# Patient Record
Sex: Male | Born: 1935 | Race: White | Hispanic: No | Marital: Single | Smoking: Never smoker
Health system: Southern US, Community
[De-identification: ages and names within clinical notes are randomized; demographics above are authoritative.]

## PROBLEM LIST (undated history)

## (undated) ENCOUNTER — Encounter: Attending: Hematology & Oncology | Primary: Hematology & Oncology

## (undated) ENCOUNTER — Ambulatory Visit: Payer: MEDICARE | Attending: Adult Health | Primary: Adult Health

## (undated) ENCOUNTER — Encounter: Attending: Adult Health | Primary: Adult Health

## (undated) ENCOUNTER — Ambulatory Visit: Payer: MEDICARE

## (undated) ENCOUNTER — Ambulatory Visit

## (undated) ENCOUNTER — Encounter

## (undated) ENCOUNTER — Telehealth: Attending: Hematology & Oncology | Primary: Hematology & Oncology

## (undated) ENCOUNTER — Telehealth: Attending: Adult Health | Primary: Adult Health

## (undated) ENCOUNTER — Telehealth

## (undated) ENCOUNTER — Ambulatory Visit: Payer: MEDICARE | Attending: Hematology & Oncology | Primary: Hematology & Oncology

## (undated) ENCOUNTER — Encounter: Attending: Pharmacist | Primary: Pharmacist

## (undated) ENCOUNTER — Encounter: Attending: Internal Medicine | Primary: Internal Medicine

## (undated) ENCOUNTER — Ambulatory Visit: Attending: Family | Primary: Family

## (undated) ENCOUNTER — Ambulatory Visit: Attending: Cardiovascular Disease | Primary: Cardiovascular Disease

## (undated) ENCOUNTER — Ambulatory Visit: Attending: Internal Medicine | Primary: Internal Medicine

## (undated) DIAGNOSIS — I2510 Atherosclerotic heart disease of native coronary artery without angina pectoris: Secondary | ICD-10-CM

## (undated) DIAGNOSIS — D649 Anemia, unspecified: Secondary | ICD-10-CM

## (undated) DIAGNOSIS — N400 Enlarged prostate without lower urinary tract symptoms: Secondary | ICD-10-CM

## (undated) DIAGNOSIS — M1990 Unspecified osteoarthritis, unspecified site: Secondary | ICD-10-CM

## (undated) DIAGNOSIS — F329 Major depressive disorder, single episode, unspecified: Secondary | ICD-10-CM

## (undated) DIAGNOSIS — J449 Chronic obstructive pulmonary disease, unspecified: Secondary | ICD-10-CM

## (undated) DIAGNOSIS — F419 Anxiety disorder, unspecified: Secondary | ICD-10-CM

## (undated) DIAGNOSIS — I10 Essential (primary) hypertension: Secondary | ICD-10-CM

## (undated) DIAGNOSIS — F0390 Unspecified dementia without behavioral disturbance: Secondary | ICD-10-CM

## (undated) DIAGNOSIS — C9110 Chronic lymphocytic leukemia of B-cell type not having achieved remission: Secondary | ICD-10-CM

## (undated) DIAGNOSIS — H9190 Unspecified hearing loss, unspecified ear: Secondary | ICD-10-CM

## (undated) DIAGNOSIS — K219 Gastro-esophageal reflux disease without esophagitis: Secondary | ICD-10-CM

## (undated) HISTORY — DX: Chronic obstructive pulmonary disease, unspecified: J44.9

## (undated) HISTORY — DX: Essential (primary) hypertension: I10

## (undated) HISTORY — DX: Gastro-esophageal reflux disease without esophagitis: K21.9

## (undated) HISTORY — PX: SKIN CANCER EXCISION: SHX779

## (undated) HISTORY — DX: Benign prostatic hyperplasia without lower urinary tract symptoms: N40.0

## (undated) HISTORY — DX: Unspecified osteoarthritis, unspecified site: M19.90

## (undated) HISTORY — DX: Major depressive disorder, single episode, unspecified: F32.9

## (undated) HISTORY — DX: Atherosclerotic heart disease of native coronary artery without angina pectoris: I25.10

## (undated) HISTORY — DX: Unspecified hearing loss, unspecified ear: H91.90

## (undated) HISTORY — DX: Anemia, unspecified: D64.9

## (undated) HISTORY — DX: Chronic lymphocytic leukemia of B-cell type not having achieved remission: C91.10

## (undated) HISTORY — DX: Unspecified dementia, unspecified severity, without behavioral disturbance, psychotic disturbance, mood disturbance, and anxiety: F03.90

## (undated) HISTORY — DX: Anxiety disorder, unspecified: F41.9

## (undated) MED ORDER — EZETIMIBE 10 MG TABLET: Freq: Every day | ORAL | 0 days

## (undated) MED ORDER — FUROSEMIDE 20 MG TABLET: Freq: Every day | ORAL | 0 days

## (undated) MED ORDER — CALCIUM CARBONATE 500 MG (1,250 MG)-VITAMIN D3 200 UNIT TABLET: Freq: Every morning | ORAL | 0.00000 days

## (undated) MED ORDER — LIPASE-PROTEASE-AMYLASE 36,000-114,000-180,000 UNIT CAPSULE,DELAY REL: ORAL | 0.00000 days

## (undated) MED ORDER — NEXLIZET 180 MG-10 MG TABLET: Freq: Every day | ORAL | 0 days

## (undated) MED ORDER — MELOXICAM 7.5 MG TABLET: Freq: Two times a day (BID) | ORAL | 0.00000 days | PRN

## (undated) MED ORDER — METOPROLOL SUCCINATE ER 25 MG TABLET,EXTENDED RELEASE 24 HR: Freq: Every day | ORAL | 0 days

## (undated) MED ORDER — TIOTROPIUM BROMIDE 18 MCG CAPSULE WITH INHALATION DEVICE: RESPIRATORY_TRACT | 0 days

---

## 2000-07-05 DIAGNOSIS — I219 Acute myocardial infarction, unspecified: Secondary | ICD-10-CM

## 2000-07-05 HISTORY — DX: Acute myocardial infarction, unspecified: I21.9

## 2017-08-12 ENCOUNTER — Encounter
Admit: 2017-08-12 | Discharge: 2017-08-12 | Payer: MEDICARE | Attending: Hematology & Oncology | Primary: Hematology & Oncology

## 2017-08-12 DIAGNOSIS — C9110 Chronic lymphocytic leukemia of B-cell type not having achieved remission: Principal | ICD-10-CM

## 2017-08-23 DIAGNOSIS — C9110 Chronic lymphocytic leukemia of B-cell type not having achieved remission: Secondary | ICD-10-CM | POA: Insufficient documentation

## 2017-08-30 ENCOUNTER — Other Ambulatory Visit: Admit: 2017-08-30 | Discharge: 2017-08-31 | Payer: MEDICARE

## 2017-08-30 ENCOUNTER — Ambulatory Visit
Admit: 2017-08-30 | Discharge: 2017-08-31 | Payer: MEDICARE | Attending: Hematology & Oncology | Primary: Hematology & Oncology

## 2017-08-30 DIAGNOSIS — C9110 Chronic lymphocytic leukemia of B-cell type not having achieved remission: Principal | ICD-10-CM

## 2017-08-30 MED ORDER — IDELALISIB 150 MG TABLET
ORAL_TABLET | Freq: Two times a day (BID) | ORAL | 6 refills | 0.00000 days | Status: CP
Start: 2017-08-30 — End: 2017-11-08

## 2017-08-30 MED ORDER — IDELALISIB 150 MG TABLET: 150 mg | tablet | Freq: Two times a day (BID) | 6 refills | 0 days | Status: AC

## 2017-09-02 ENCOUNTER — Ambulatory Visit: Admit: 2017-09-02 | Discharge: 2017-09-03 | Payer: MEDICARE

## 2017-09-02 DIAGNOSIS — F0390 Unspecified dementia without behavioral disturbance: Principal | ICD-10-CM

## 2017-09-20 NOTE — Unmapped (Signed)
[facility]   Patient Onboarding/Medication Counseling    Eugene Carroll is a 82 y.o. male with leukemia who I am counseling today on initiation of therapy.    Medication: Zydelig 150mg     Verified patient's date of birth / HIPAA.      Education Provided: ??    Dose/Administration discussed: Medication is being administered inpatient at [facility].  Pharmacist: Dicie Beam will counsel patient.     Storage requirements: this medicine should be stored at room temperature.     Side effects / precautions discussed: Medication is being administered inpatient at [facility].  Pharmacist: Dicie Beam will counsel patient.     Handling precautions / disposal reviewed:  Medication is being administered inpatient at [facility].  Pharmacist: Dicie Beam will counsel patient. .    Drug Interactions: other medications reviewed and up to date in Epic.  Medication is being administered inpatient at [facility].  Pharmacist: Dicie Beam will counsel patient. .    Comorbidities/Allergies: reviewed and up to date in Epic.    Verified therapy is appropriate and should continue      Delivery Information    Medication Assistance provided: none    Anticipated copay of $0 reviewed with patient. Verified delivery address in FSI and reviewed medication storage requirement.    Scheduled delivery date: 09/23/2017    Explained that we ship using UPS or courier and this shipment will not require a signature.      Explained the services we provide at [facility] and that each month we would call to set up refills.  Stressed importance of returning phone calls so that we could ensure they receive their medications in time each month.  Informed patient that we should be setting up refills 7-10 days prior to when they will run out of medication.  Informed patient that welcome packet will be sent.      Patient verbalized understanding of the above information as well as how to contact the pharmacy at [phone] option 4 with any questions/concerns.  The pharmacy is open Monday through Friday 8:30am-4:30pm.  A pharmacist is available 24/7 via pager to answer any clinical questions they may have.        Patient Specific Needs      ? Patient has no physical, cognitive, or cultural barriers.    Patient prefers to have medications discussed with  Caregiver (Pharmacist: Dicie Beam)    ? Patient is able to read and understand education materials at a high school level or above.    ? Patient's primary language is  English           Perez Dirico  Anders Grant  [facility] Specialty Pharmacist

## 2017-09-23 MED FILL — ZYDELIG/150MG/TABS: ZYDELIG/150MG/TABS | 30 days supply | Qty: 60 | Fill #0

## 2017-10-07 NOTE — Unmapped (Signed)
[facility] Triage Note     Patient: Eugene Carroll     Reason for call:  diarrhea    Time call returned: 1210     Phone Assessment: Nurse a [facility] called to make Dr. Lonni Fix aware of pt's symptoms ahead of his 4/9 appt.     Pt still having diarrhea. They have treated with imodium and lomotil, but pt was constipated last week, so they are going easy with the imodium. Already on a dairy free diet. Pt also c/o nausea, abdominal discomfort, fatigue, weight loss. On phenergan as well. Pt has a solitary gall stone. LFTs are okay, belly soft with no tenderness. Psychiatrist is asking if pt's drug can be changed d/t side effects.      Triage Recommendations: n/a     Patient Response: Okay to discuss at Tuesday's appt.      Outstanding tasks: notification

## 2017-10-07 NOTE — Unmapped (Signed)
Hi,    The patient nurse Sondra Come has contacted the [facility] with report of diarrhea.  We have sent a page to the Oncology Triage Pager per our urgent page guidelines.      Thank you,  Christell Faith  [facility] Communication Center  [phone]

## 2017-10-07 NOTE — Unmapped (Signed)
Suann Larry (Child psychotherapist) contacted the communication center returning [employer] call on the behalf of Dr. Kyung Rudd.  Tonya did not state the reason for the call.      Dr. Sondra Come may be contacted at [phone] or [phone].    Archie Patten may be reached at [phone].    Thanks in advance,  Tylene Fantasia  [facility] Communication Center  [phone]

## 2017-10-07 NOTE — Unmapped (Signed)
See other message dated today

## 2017-10-11 ENCOUNTER — Ambulatory Visit
Admit: 2017-10-11 | Discharge: 2017-10-12 | Payer: MEDICARE | Attending: Hematology & Oncology | Primary: Hematology & Oncology

## 2017-10-11 ENCOUNTER — Other Ambulatory Visit: Admit: 2017-10-11 | Discharge: 2017-10-12 | Payer: MEDICARE

## 2017-10-11 DIAGNOSIS — C9110 Chronic lymphocytic leukemia of B-cell type not having achieved remission: Principal | ICD-10-CM

## 2017-10-11 LAB — CBC W/ AUTO DIFF
BASOPHILS ABSOLUTE COUNT: 0.1 10*9/L (ref 0.0–0.1)
BASOPHILS RELATIVE PERCENT: 0.8 %
EOSINOPHILS ABSOLUTE COUNT: 0.2 10*9/L (ref 0.0–0.4)
EOSINOPHILS RELATIVE PERCENT: 0.9 %
HEMATOCRIT: 42.5 % (ref 41.0–53.0)
HEMOGLOBIN: 14.5 g/dL (ref 13.5–17.5)
LARGE UNSTAINED CELLS: 3 % (ref 0–4)
LYMPHOCYTES ABSOLUTE COUNT: 10.7 10*9/L — ABNORMAL HIGH (ref 1.5–5.0)
LYMPHOCYTES RELATIVE PERCENT: 58 %
MEAN CORPUSCULAR HEMOGLOBIN CONC: 34.1 g/dL (ref 31.0–37.0)
MEAN CORPUSCULAR HEMOGLOBIN: 29.3 pg (ref 26.0–34.0)
MEAN CORPUSCULAR VOLUME: 85.9 fL (ref 80.0–100.0)
MONOCYTES ABSOLUTE COUNT: 0.7 10*9/L (ref 0.2–0.8)
MONOCYTES RELATIVE PERCENT: 3.6 %
NEUTROPHILS ABSOLUTE COUNT: 6.2 10*9/L (ref 2.0–7.5)
NEUTROPHILS RELATIVE PERCENT: 33.7 %
PLATELET COUNT: 288 10*9/L (ref 150–440)
RED CELL DISTRIBUTION WIDTH: 14.4 % (ref 12.0–15.0)

## 2017-10-11 LAB — COMPREHENSIVE METABOLIC PANEL
ALBUMIN: 3.5 g/dL (ref 3.5–5.0)
ALKALINE PHOSPHATASE: 101 U/L (ref 38–126)
ALT (SGPT): 27 U/L (ref 19–72)
ANION GAP: 9 mmol/L (ref 9–15)
AST (SGOT): 21 U/L (ref 19–55)
BLOOD UREA NITROGEN: 11 mg/dL (ref 7–21)
BUN / CREAT RATIO: 12
CALCIUM: 9.2 mg/dL (ref 8.5–10.2)
CHLORIDE: 100 mmol/L (ref 98–107)
CO2: 21 mmol/L — ABNORMAL LOW (ref 22.0–30.0)
CREATININE: 0.89 mg/dL (ref 0.70–1.30)
EGFR MDRD AF AMER: >=60 mL/min/{1.73_m2} (ref >=60)
EGFR MDRD NON AF AMER: >=60 mL/min/{1.73_m2} (ref >=60)
GLUCOSE RANDOM: 97 mg/dL (ref 65–179)
POTASSIUM: 4.7 mmol/L (ref 3.5–5.0)
PROTEIN TOTAL: 5.5 g/dL — ABNORMAL LOW (ref 6.5–8.3)
SODIUM: 130 mmol/L — ABNORMAL LOW (ref 135–145)

## 2017-10-11 LAB — CREATININE: Creatinine:MCnc:Pt:Ser/Plas:Qn:: 0.89

## 2017-10-11 LAB — SMEAR REVIEW

## 2017-10-11 LAB — BASOPHILS ABSOLUTE COUNT: Lab: 0.1

## 2017-10-11 NOTE — Unmapped (Signed)
Follow up Visit Note    Patient Name: Eugene Carroll  Patient Age: 82 y.o.  Encounter Date: 10/11/2017    Chief complaint/Reason for visit: New Patient visit, CLL    Assessment:  Eugene Carroll is a 82 y.o. male with a past medical history of CAD, CHF, Hx of CVA, HTN, HLD, and anxiety who presents for evaluation of his CLL.     Diagnosed with diffuse lymphadenopathy in May 2014, LN biopsy CD5+, CD23+, CD20 dim. Cytogenetics were consistent with 11q deletion and IGHV was unmutated. He began treatment in 2014 with Bendamustine/Rituximab c/b a mild rash. Therapy was changed to Cytoxan/Rituximab. Response reportedly consistent with a CR in 2015. Developed recurrent disease in 2016 with enlarging abdominal lymphadenopathy. He was observed until 2018 at which time he developed rapidly rising WBC, hot flashes, and night sweats. Therapy re-initiated with monthly Rituximab x 6 (completed Oct 2018) + Idelalisib c/b mild diarrhea. He last saw Eugene Carroll in Dec 2018, still on Idelalisib.    He was recently admitted to [facility] (psychiatric admission). He is here today for recommnedations for ongoing care of his CLL. I have concerns that he is having significant toxicity from the idela (diarrhea) vs disease progression in setting of 20 lb weight loss.     I spoke with his daughter Eugene Carroll who corroborated the history.    I also spoke with Eugene Carroll who notes that pt was given loperamide for his diarrhea - this actually led to constipation and a fecal load was noted on abd x-ray. The pt was given miralax which helped but then he developed diarrhea again, is back on loperamide. He's also had nausea and is on protonix just the past day. She notes poor appetite too. They have been avoiding lactose products.     Recommendations:    1) CLL, unmutated IGVH w/ del11q with recent weight loss  - I recommend to hold idelalisib given ongoing diarrhea and wt loss  - suspect wt loss is due to ongoing diarrhea vs disease progression vs dementia/psych cause  - Recommend Imodium as needed for diarrhea/loose stools.  - return in 2 weeks for visit and CT scan (c/a/p) to evaluate for disease status  - may consider restarting idelalisib at a reduced dose vs continuing a drug holiday vs changing to an alternate therapy (if diarrhea continues and is significant prednisone for colitis)    Infection ppx while on idelalisib  - would continue acyclovir and if resumes idelalisib should also go on PCP prophylaxis  - CMV monitoring; checked today     2) Diarrhea:   - recommend prn imodium for diarrhea control, hopefully idealisib hold will help too (if not, can try steroids)    3) Low IgG levels:   - checked today due to symptoms of persitent cough. Total IgG found to be low at 275.   - would consider IVIG if >1 grade 3 infection is observed.     4) Dementia:   - currently resides in [facility] (psych inpatient stay).     5) Supportive care:   - listed as DNR on paperwork from [facility] today.   - patient's daughter, Eugene Carroll is the best contact [phone]); spoke with her today (also spoke with Eugene Carroll at facility, [phone])    Due to this patient's diagnosis, he is at significant risk for subsequent morbidity and/or mortality.      Eugene Leber, MD      Interval History:  Since last seen here,  he reports poor appetite and worsening diarrhea - has about 4 BMs a day.     Weight is down 21 lbs since last visit.    Otherwise, he denies new constitutional symptoms such as anorexia, weight loss, night sweats or unexplained fevers.  Furthermore, he denies symptoms of marrow failure: unexplained bleeding or bruising, recurrent or unexplained intercurrent infections, dyspnea on exertion, lightheadedness, palpitations or chest pain.  There have been no new or unexplained pains or self-identified masses, swelling or enlarged lymph nodes.    Past Medical, Surgical and Family History were reviewed and pertinent updates were made in the Electronic Medical Record        Oncology History:    Oncology History    CLL  Prognostic testing: 11q deletion present on karyotype and FISH from 11/15/12  IGHV: unmutated at 0.0%, 1-69*01    Treatment history:  Bendamustine-rituximab  Cytoxan-rituximab  Most recently on idelalisib-rituximab        CLL (chronic lymphocytic leukemia) (CMS-HCC)    08/23/2017 Initial Diagnosis     CLL (chronic lymphocytic leukemia) (CMS-HCC)          REVIEW OF SYSTEMS:   CONSTITUTIONAL: No fevers, chills, weight loss, night sweats. +occasional night sweats.   HEENT: Eyes: No blurred vision. ENT: No earache, sore throat or runny nose.   CARDIOVASCULAR: No chest pain, palpitations.  RESPIRATORY: No shortness of breath, PND or orthopnea. +cough with yellow sputum  GASTROINTESTINAL: No nausea, vomiting or diarrhea.   GENITOURINARY: No dysuria, frequency or urgency.   MUSCULOSKELETAL: No muscle pain or weakness.  SKIN: No rashes or other skin complaints.  NEUROLOGIC: No headaches, paresthesias, fasciculations, seizures.  PSYCHIATRIC: + dementia   ENDOCRINE: No heat or cold intolerance, polyuria or polydipsia.   HEMATOLOGICAL: No easy bruising or bleeding.     [place]sky Performance Status: 13 - Requiring some help, can take care of most personal requirements  ECOG Performance Status: 2 - Ambulatory and capable of all selfcare but unable to carry out any work activities. Up and about more than 50% of waking hours      Allergies   Allergen Reactions   ??? Bayer Aspirin [Aspirin] Diarrhea   ??? Ciprofloxacin Diarrhea   ??? Zoloft [Sertraline] Other (See Comments)     hyponatremia         Current Outpatient Medications   Medication Sig Dispense Refill   ??? acetaminophen (TYLENOL) 325 MG tablet Take 650 mg by mouth daily as needed for pain.     ??? acyclovir (ZOVIRAX) 400 MG tablet Take 1 tablet by mouth Two (2) times a day.  6   ??? ARIPiprazole (ABILIFY) 2 MG tablet Take 2 mg by mouth daily.     ??? aspirin (ECOTRIN) 81 MG tablet Take 81 mg by mouth daily.     ??? atorvastatin (LIPITOR) 10 MG tablet Take 10 mg by mouth daily.     ??? brexpiprazole (REXULTI) 1 mg Tab Continuous.     ??? clonazePAM (KLONOPIN) 0.5 MG tablet Take 0.25 mg by mouth two (2) times a day as needed for anxiety.     ??? docusate sodium (DOK) 100 MG capsule Take 1 capsule by mouth Two (2) times a day.     ??? folic acid (FOLVITE) 1 MG tablet Take 1 mg by mouth daily.     ??? idelalisib (ZYDELIG) 150 mg Tab Take 150 mg by mouth Two (2) times a day.     ??? idelalisib 150 mg Tab Take 1 tablet (150 mg total)  by mouth Two (2) times a day. 60 tablet 6   ??? lisinopril (PRINIVIL,ZESTRIL) 20 MG tablet Take 20 mg by mouth daily.     ??? loperamide (IMODIUM) 2 mg capsule Take 2 mg by mouth 4 (four) times a day as needed for diarrhea.     ??? loratadine (CLARITIN) 10 mg tablet Take 10 mg by mouth nightly.     ??? magnesium hydroxide (MAGNESIUM HYDROXIDE) 400 mg/5 mL Susp Take 30 mL by mouth daily as needed.     ??? magnesium oxide (MAG-OX) 400 mg (241.3 mg magnesium) tablet Take 400 mg by mouth Two (2) times a day.     ??? mirtazapine (REMERON) 30 MG tablet Take 30 mg by mouth nightly.     ??? multivitamin (TAB-A-VITE/THERAGRAN) per tablet Take 1 tablet by mouth daily.     ??? nitroglycerin (NITROSTAT) 0.4 MG SL tablet Place 0.4 mg under the tongue every five (5) minutes as needed for chest pain.     ??? omeprazole (PRILOSEC) 20 MG capsule Take 1 capsule by mouth Two (2) times a day.     ??? pantoprazole (PROTONIX) 40 MG tablet Take 40 mg by mouth daily.     ??? promethazine (PHENERGAN) 12.5 MG tablet Take 12.5 mg by mouth every eight (8) hours as needed for nausea.     ??? pseudoephedrine (SUDAFED) 30 MG tablet Take 30 mg by mouth daily as needed for congestion.     ??? simethicone (MYLICON) 80 MG chewable tablet Chew 80 mg every six (6) hours as needed for flatulence.     ??? tamsulosin (FLOMAX) 0.4 mg capsule Take 1 capsule by mouth daily.       No current facility-administered medications for this visit.      Physical exam:  Vitals:    10/11/17 1321 BP: 112/70   Pulse: 104   Resp: 15   Temp: 36.4 ??C (97.5 ??F)   SpO2: 99%     General: Resting in no apparent distress  HEENT:  Pupils are equal, round and reactive to light.  There is no scleral icterus and no conjunctival injection.  The oral mucosa does not demonstrate ulceration, erythema, exudate or purpura.  Nares show no bleeding.  No thyromegaly.  No jugular venous distention.    Lymph node exam:  No lymphadenopathy in the occipital, auricular, anterior/posterior cervical, submental, supraclavicular, axillary, inguinal regions.  Heart:  Regular rate and rhythm.  Normal S1 and S2 without S3 or S4.  There are no murmurs, gallops or rubs.  Lungs:  Breathing is unlabored and patient is speaking full sentences with ease.  No stridor.  Auscultation of lung fields reveals normal air movemen. Crackles present at the bases bilaterally.   Gastrointestinal:  No distention or pain on palpation.  Bowel sounds are present and normal in quality.  There is no palpable hepatomegaly or splenomegaly.  No palpable masses.  Skin:  No rashes, petechiae or purpura.  No areas of skin breakdown.  Musculoskeletal:  Pain with palpation of the 11th rib on the left, potential step off consistent with fracture (prior fall/injury). There are no grossly-evident joint effusions or deformities.  Range of motion about the shoulder, elbow, hips and knees is grossly normal.  There is no pain on palpation of the spinous processes of the cervical, thoracic or lumbar vertebral bodies.  Psychiatric:  Alert and oriented to person, not to place, time and situation.    Neurologic:  CN II-XII are normal and symmetric. Gait is normal. Otherwise neurologic  exam is grossly non-focal.  Extremities:  Appear well-perfused.  There is no clubbing, edema or cyanosis.    Orders/Results:  Labs and pathology have been reviewed and pertinent results are as follows:    No visits with results within 1 Day(s) from this visit.   Latest known visit with results is: Lab on 08/30/2017   Component Date Value   ??? Sodium 08/30/2017 139    ??? Potassium 08/30/2017 4.1    ??? Chloride 08/30/2017 103    ??? CO2 08/30/2017 28.0    ??? BUN 08/30/2017 10    ??? Creatinine 08/30/2017 0.72    ??? BUN/Creatinine Ratio 08/30/2017 14    ??? EGFR MDRD Non Af Amer 08/30/2017 >=60    ??? EGFR MDRD Af Amer 08/30/2017 >=60    ??? Anion Gap 08/30/2017 8*   ??? Glucose 08/30/2017 91    ??? Calcium 08/30/2017 9.2    ??? Albumin 08/30/2017 3.5    ??? Total Protein 08/30/2017 5.2*   ??? Total Bilirubin 08/30/2017 0.4    ??? AST 08/30/2017 32    ??? ALT 08/30/2017 45    ??? Alkaline Phosphatase 08/30/2017 60    ??? LDH 08/30/2017 486    ??? WBC 08/30/2017 13.0*   ??? RBC 08/30/2017 4.34*   ??? HGB 08/30/2017 12.8*   ??? HCT 08/30/2017 37.6*   ??? MCV 08/30/2017 86.6    ??? [facility] 08/30/2017 29.5    ??? MCHC 08/30/2017 34.0    ??? RDW 08/30/2017 15.3*   ??? MPV 08/30/2017 7.7    ??? Platelet 08/30/2017 289    ??? Neutrophils % 08/30/2017 30.3    ??? Lymphocytes % 08/30/2017 60.5    ??? Monocytes % 08/30/2017 3.5    ??? Eosinophils % 08/30/2017 1.2    ??? Basophils % 08/30/2017 0.7    ??? Neutrophil Left Shift 08/30/2017 1+*   ??? Absolute Neutrophils 08/30/2017 3.9    ??? Absolute Lymphocytes 08/30/2017 7.9*   ??? Absolute Monocytes 08/30/2017 0.5    ??? Absolute Eosinophils 08/30/2017 0.2    ??? Absolute Basophils 08/30/2017 0.1    ??? Large Unstained Cells 08/30/2017 4    ??? Smear Review Comments 08/30/2017 See Comment*   ??? Burr Cells 08/30/2017 Present*   ??? Total IgG 08/30/2017 275*       Final Diagnosis   Date Value Ref Range Status   08/12/2017   Final    (Outisde Case #:  [id number], dated 04/13/2013)  Lymph node, right axillary, biopsy   -  Chronic lymphocytic leukemia / small lymphocytic lymphoma (See Comment)          (Outside Case #:  [id number], dated 11/15/2012)  Bone marrow, aspiration and biopsy    -  Hypercellular bone marrow (90%) with involvement by chronic lymphocytic leukemia, representing approximately 70% or marrow cellularity by CD20 immunohistochemical stain (See Comment)   -  By outside report, cytogenetic and FISH results are abnormal; see details in scanned documents.    IMAGING:   11/17/16: CT A/P  - minimal improvement in diffuse LAD in the abdomen.

## 2017-10-11 NOTE — Unmapped (Signed)
Please return in 2 weeks for a follow up visit and labs.    I spoke with Dr. Sondra Come and recommended that you stop the idelalisib Eugene Carroll) drug for the time being, as this may be causing you the diarrhea and weight loss problem.     We will bring you back here in 2 weeks to check on how you are feeling and also to get a CT scan to look at the status of your CLL.    Please call us if you experience: ??  1. Nausea or vomiting not controlled by nausea medicines  2. Fever of 100.5 F or higher, shaking chills, drenching night sweats.  3. Uncontrolled pain  4. Any rapidly enlarging lymph node or mass  5. Unintentional weight loss  6. Any other concerning symptom     MyChart Messages  For your safety and best care, please DO NOT use MyChart messages to report symptoms. (Symptoms should be reported by calling the nurse triage line). Please use MyChart for non-urgent matters such as general questions, non-urgent prescription refills, or non-urgent scheduling issues.     ?? Please do not use MyChart for URGENT messages, as messages are only checked during regular business hours.     ?? Please note that MyChart messages may be routed a central pool and one of your provider???s team members will get back to you.  - Expect up to 3 business days for response     If you have any other questions, please do not hesitate to contact us.    Nurse Navigator: Frankey Poot, RN ??    For health related questions Monday through Friday 8 AM??? 5 PM : please call the office at [phone] and ask for Frankey Poot, RN, our nurse navigator.   For appointment changes call: Main Clinic [phone].  Toll free number is [phone].    On Nights, Weekends and Holidays:  Call [phone] and ask for the adult hematologist/oncologist on call.      [facility]  [address]  [place], [place] [address]  [email or web address]    Results for orders placed or performed in visit on 10/11/17   Comprehensive Metabolic Panel   Result Value Ref Range Sodium 130 (L) 135 - 145 mmol/L    Potassium 4.7 3.5 - 5.0 mmol/L    Chloride 100 98 - 107 mmol/L    CO2 21.0 (L) 22.0 - 30.0 mmol/L    BUN 11 7 - 21 mg/dL    Creatinine 2.44 0.10 - 1.30 mg/dL    BUN/Creatinine Ratio 12     EGFR MDRD Non Af Amer >=60 >=60 mL/min/1.77m2    EGFR MDRD Af Amer >=60 >=60 mL/min/1.41m2    Anion Gap 9 9 - 15 mmol/L    Glucose 97 65 - 179 mg/dL    Calcium 9.2 8.5 - 27.2 mg/dL    Albumin 3.5 3.5 - 5.0 g/dL    Total Protein 5.5 (L) 6.5 - 8.3 g/dL    Total Bilirubin 0.4 0.0 - 1.2 mg/dL    AST 21 19 - 55 U/L    ALT 27 19 - 72 U/L    Alkaline Phosphatase 101 38 - 126 U/L   CBC w/ Differential   Result Value Ref Range    WBC 18.5 (H) 4.5 - 11.0 10*9/L    RBC 4.95 4.50 - 5.90 10*12/L    HGB 14.5 13.5 - 17.5 g/dL    HCT 53.6 64.4 - 03.4 %    MCV 85.9 80.0 -  100.0 fL    MCH 29.3 26.0 - 34.0 pg    MCHC 34.1 31.0 - 37.0 g/dL    RDW 99.2 42.6 - 83.4 %    MPV 7.7 7.0 - 10.0 fL    Platelet 288 150 - 440 10*9/L    Neutrophils % 33.7 %    Lymphocytes % 58.0 %    Monocytes % 3.6 %    Eosinophils % 0.9 %    Basophils % 0.8 %    Neutrophil Left Shift 1+ (A) Not Present    Absolute Neutrophils 6.2 2.0 - 7.5 10*9/L    Absolute Lymphocytes 10.7 (H) 1.5 - 5.0 10*9/L    Absolute Monocytes 0.7 0.2 - 0.8 10*9/L    Absolute Eosinophils 0.2 0.0 - 0.4 10*9/L    Absolute Basophils 0.1 0.0 - 0.1 10*9/L    Large Unstained Cells 3 0 - 4 %

## 2017-10-11 NOTE — Unmapped (Signed)
Labs drawn and sent for analysis, patient care provided by D maxwell

## 2017-10-12 LAB — CMV DNA, QUANTITATIVE, PCR

## 2017-10-12 LAB — CMV COMMENT: Lab: 0

## 2017-10-19 ENCOUNTER — Ambulatory Visit: Admit: 2017-10-19 | Discharge: 2017-10-20 | Payer: MEDICARE

## 2017-10-19 DIAGNOSIS — C9110 Chronic lymphocytic leukemia of B-cell type not having achieved remission: Principal | ICD-10-CM

## 2017-10-19 NOTE — Unmapped (Signed)
I received a call from Dr. Jane Canary, [facility].    Pt continues to have diarrhea despite idelalisib d/c 8 days ago and is now in the hospital ward receiving IV fluids in setting of dehydration. A thorough w/u for alterate causes is negative and the diarrhea continues despite imodium.     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 I also asked that they start him on PJP ppx with Bactrim.

## 2017-10-23 DIAGNOSIS — D72829 Elevated white blood cell count, unspecified: Secondary | ICD-10-CM | POA: Insufficient documentation

## 2017-10-23 DIAGNOSIS — E872 Acidosis, unspecified: Secondary | ICD-10-CM | POA: Insufficient documentation

## 2017-10-23 DIAGNOSIS — E43 Unspecified severe protein-calorie malnutrition: Secondary | ICD-10-CM | POA: Insufficient documentation

## 2017-10-23 DIAGNOSIS — E878 Other disorders of electrolyte and fluid balance, not elsewhere classified: Secondary | ICD-10-CM | POA: Insufficient documentation

## 2017-10-23 DIAGNOSIS — E876 Hypokalemia: Secondary | ICD-10-CM | POA: Insufficient documentation

## 2017-10-23 DIAGNOSIS — E860 Dehydration: Secondary | ICD-10-CM | POA: Insufficient documentation

## 2017-10-23 DIAGNOSIS — K521 Toxic gastroenteritis and colitis: Secondary | ICD-10-CM | POA: Insufficient documentation

## 2017-11-03 NOTE — Unmapped (Signed)
PER MORGAN AT [facility] HAD ADR TO MEDICATION AND IT HAS BEEN STOPPED. IF ONCOLOGY DECIDES TO CONTINUE THEY WILL CONTACT us.

## 2017-11-08 ENCOUNTER — Other Ambulatory Visit: Admit: 2017-11-08 | Discharge: 2017-11-08 | Payer: MEDICARE

## 2017-11-08 ENCOUNTER — Ambulatory Visit
Admit: 2017-11-08 | Discharge: 2017-11-08 | Payer: MEDICARE | Attending: Hematology & Oncology | Primary: Hematology & Oncology

## 2017-11-08 DIAGNOSIS — C9110 Chronic lymphocytic leukemia of B-cell type not having achieved remission: Principal | ICD-10-CM

## 2017-11-08 DIAGNOSIS — C9112 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: Secondary | ICD-10-CM

## 2017-11-08 DIAGNOSIS — Z7289 Other problems related to lifestyle: Secondary | ICD-10-CM

## 2017-11-08 DIAGNOSIS — F0391 Unspecified dementia with behavioral disturbance: Secondary | ICD-10-CM

## 2017-11-08 DIAGNOSIS — Z1159 Encounter for screening for other viral diseases: Secondary | ICD-10-CM

## 2017-11-08 DIAGNOSIS — F0390 Unspecified dementia without behavioral disturbance: Secondary | ICD-10-CM | POA: Insufficient documentation

## 2017-11-08 LAB — COMPREHENSIVE METABOLIC PANEL
ALBUMIN: 3.5 g/dL (ref 3.5–5.0)
ALKALINE PHOSPHATASE: 90 U/L (ref 38–126)
ALT (SGPT): 105 U/L — ABNORMAL HIGH (ref 19–72)
ANION GAP: 9 mmol/L (ref 9–15)
AST (SGOT): 45 U/L (ref 19–55)
BILIRUBIN TOTAL: 0.4 mg/dL (ref 0.0–1.2)
BLOOD UREA NITROGEN: 19 mg/dL (ref 7–21)
CALCIUM: 9 mg/dL (ref 8.5–10.2)
CHLORIDE: 97 mmol/L — ABNORMAL LOW (ref 98–107)
CO2: 28 mmol/L (ref 22.0–30.0)
CREATININE: 0.66 mg/dL — ABNORMAL LOW (ref 0.70–1.30)
EGFR MDRD AF AMER: >=60 mL/min/{1.73_m2} (ref >=60)
GLUCOSE RANDOM: 121 mg/dL (ref 65–179)
POTASSIUM: 5.1 mmol/L — ABNORMAL HIGH (ref 3.5–5.0)
PROTEIN TOTAL: 5.3 g/dL — ABNORMAL LOW (ref 6.5–8.3)
SODIUM: 134 mmol/L — ABNORMAL LOW (ref 135–145)

## 2017-11-08 LAB — CBC W/ AUTO DIFF
BASOPHILS ABSOLUTE COUNT: 0 10*9/L (ref 0.0–0.1)
BASOPHILS RELATIVE PERCENT: 0.2 %
EOSINOPHILS ABSOLUTE COUNT: 0 10*9/L (ref 0.0–0.4)
EOSINOPHILS RELATIVE PERCENT: 0.1 %
HEMATOCRIT: 35.4 % — ABNORMAL LOW (ref 41.0–53.0)
HEMOGLOBIN: 12 g/dL — ABNORMAL LOW (ref 13.5–17.5)
LARGE UNSTAINED CELLS: 1 % (ref 0–4)
LYMPHOCYTES ABSOLUTE COUNT: 3.7 10*9/L (ref 1.5–5.0)
LYMPHOCYTES RELATIVE PERCENT: 29.8 %
MEAN CORPUSCULAR HEMOGLOBIN CONC: 33.9 g/dL (ref 31.0–37.0)
MEAN CORPUSCULAR HEMOGLOBIN: 31.1 pg (ref 26.0–34.0)
MEAN PLATELET VOLUME: 7.4 fL (ref 7.0–10.0)
MONOCYTES ABSOLUTE COUNT: 0.3 10*9/L (ref 0.2–0.8)
MONOCYTES RELATIVE PERCENT: 2.7 %
NEUTROPHILS ABSOLUTE COUNT: 8.3 10*9/L — ABNORMAL HIGH (ref 2.0–7.5)
NEUTROPHILS RELATIVE PERCENT: 66.2 %
RED BLOOD CELL COUNT: 3.86 10*12/L — ABNORMAL LOW (ref 4.50–5.90)
RED CELL DISTRIBUTION WIDTH: 17.9 % — ABNORMAL HIGH (ref 12.0–15.0)
WBC ADJUSTED: 12.5 10*9/L — ABNORMAL HIGH (ref 4.5–11.0)

## 2017-11-08 LAB — GLUCOSE RANDOM: Glucose:MCnc:Pt:Ser/Plas:Qn:: 121

## 2017-11-08 LAB — SMEAR REVIEW

## 2017-11-08 LAB — BASOPHILS ABSOLUTE COUNT: Lab: 0

## 2017-11-08 NOTE — Unmapped (Signed)
I am concerned about your ongoing weight loss - this may be from multiple factors, but the CLL may be an important one - I think it is reasonable to consider going back on a treatment for the CLL. I spoke to your daughter who agrees - we will try a treatment that has less side effects, due to your age.    Please return in 2 weeks for a follow up visit and labs and infusion of the new treatment (obinutuzumab). If we don' get insurance approval for this drug, we will contact you/your daughter to make alternate plans.    I would continue on the Bactrim while you are tapering off the steroids. You can continue to slowly taper off the steroids now that the colitis is resolving.    Please call us if you experience: ??  1. Nausea or vomiting not controlled by nausea medicines  2. Fever of 100.5 F or higher, shaking chills, drenching night sweats.  3. Uncontrolled pain  4. Any rapidly enlarging lymph node or mass  5. Unintentional weight loss  6. Any other concerning symptom     MyChart Messages  For your safety and best care, please DO NOT use MyChart messages to report symptoms. (Symptoms should be reported by calling the nurse triage line). Please use MyChart for non-urgent matters such as general questions, non-urgent prescription refills, or non-urgent scheduling issues.     ?? Please do not use MyChart for URGENT messages, as messages are only checked during regular business hours.     ?? Please note that MyChart messages may be routed a central pool and one of your provider???s team members will get back to you.  - Expect up to 3 business days for response     If you have any other questions, please do not hesitate to contact us.    Nurse Navigator: Frankey Poot, RN ??    For health related questions Monday through Friday 8 AM??? 5 PM : please call the office at [phone] and ask for Frankey Poot, RN, our nurse navigator.   For appointment changes call: Main Clinic [phone].  Toll free number is [phone].    On Nights, Weekends and Holidays:  Call [phone] and ask for the adult hematologist/oncologist on call.      [facility]  [address]  [place], [place] [address]  [email or web address]    Results for orders placed or performed in visit on 11/08/17   Comprehensive Metabolic Panel   Result Value Ref Range    Sodium 134 (L) 135 - 145 mmol/L    Potassium 5.1 (H) 3.5 - 5.0 mmol/L    Chloride 97 (L) 98 - 107 mmol/L    CO2 28.0 22.0 - 30.0 mmol/L    BUN 19 7 - 21 mg/dL    Creatinine 3.66 (L) 0.70 - 1.30 mg/dL    BUN/Creatinine Ratio 29     EGFR MDRD Non Af Amer >=60 >=60 mL/min/1.44m2    EGFR MDRD Af Amer >=60 >=60 mL/min/1.9m2    Anion Gap 9 9 - 15 mmol/L    Glucose 121 65 - 179 mg/dL    Calcium 9.0 8.5 - 44.0 mg/dL    Albumin 3.5 3.5 - 5.0 g/dL    Total Protein 5.3 (L) 6.5 - 8.3 g/dL    Total Bilirubin 0.4 0.0 - 1.2 mg/dL    AST 45 19 - 55 U/L    ALT 105 (H) 19 - 72 U/L    Alkaline Phosphatase 90  38 - 126 U/L   CBC w/ Differential   Result Value Ref Range    WBC 12.5 (H) 4.5 - 11.0 10*9/L    RBC 3.86 (L) 4.50 - 5.90 10*12/L    HGB 12.0 (L) 13.5 - 17.5 g/dL    HCT 08.6 (L) 57.8 - 53.0 %    MCV 91.8 80.0 - 100.0 fL    MCH 31.1 26.0 - 34.0 pg    MCHC 33.9 31.0 - 37.0 g/dL    RDW 46.9 (H) 62.9 - 15.0 %    MPV 7.4 7.0 - 10.0 fL    Platelet 218 150 - 440 10*9/L    Neutrophils % 66.2 %    Lymphocytes % 29.8 %    Monocytes % 2.7 %    Eosinophils % 0.1 %    Basophils % 0.2 %    Neutrophil Left Shift 1+ (A) Not Present    Absolute Neutrophils 8.3 (H) 2.0 - 7.5 10*9/L    Absolute Lymphocytes 3.7 1.5 - 5.0 10*9/L    Absolute Monocytes 0.3 0.2 - 0.8 10*9/L    Absolute Eosinophils 0.0 0.0 - 0.4 10*9/L    Absolute Basophils 0.0 0.0 - 0.1 10*9/L    Large Unstained Cells 1 0 - 4 %    Macrocytosis Slight (A) Not Present    Anisocytosis Slight (A) Not Present

## 2017-11-08 NOTE — Unmapped (Signed)
Follow up Visit Note    Patient Name: Eugene Carroll  Patient Age: 82 y.o.  Encounter Date: 11/08/2017    Chief complaint/Reason for visit: CLL    Assessment:  Eugene Carroll is a 82 y.o. male with a past medical history of CAD, CHF, Hx of CVA, HTN, HLD, and anxiety who presents for evaluation of his CLL.     Diagnosed with diffuse lymphadenopathy in May 2014, LN biopsy CD5+, CD23+, CD20 dim. Cytogenetics were consistent with 11q deletion and IGHV was unmutated. He began treatment in 2014 with Bendamustine/Rituximab c/b a mild rash. Therapy was changed to Cytoxan/Rituximab. Response reportedly consistent with a CR in 2015. Developed recurrent disease in 2016 with enlarging abdominal lymphadenopathy. He was observed until 2018 at which time he developed rapidly rising WBC, hot flashes, and night sweats. Therapy re-initiated with monthly Rituximab x 6 (completed Oct 2018) + Idelalisib c/b likely colitis - I recommended to stop this at prior visit and he was treated with steroids to control - now tapering off.    I spoke with his daughter [name] who corroborated the history and I reviewed treatment options with her - including no further treatment. Given ongoing weight loss, this may be a sign of progressive CLL - I would avoid further agents with high toxicity potential to minimize harm to pt. I suggested obinutuzumab monotherapy which is generally well tolerated (aside from infusion reactions). We will work to get this approved and plan to start in ~2 weeks.    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 Allena Katz who will start allopurinol in advance of visit  - check Hep B and C testing today  - RTC in 2 weeks to plan start - NP visit    Infection ppx  - continue acyclovir and Bactrim ppx (can d/c bactrim once off steroids)    2) Diarrhea:   - recommend prn imodium for diarrhea control, hopefully idealisib hold will help too (if not, can try steroids)    3) Low IgG levels:   - checked today due to symptoms of persitent cough. Total IgG found to be low at 275.   - would consider IVIG if >1 grade 3 infection is observed.     4) Dementia:   - currently resides in [facility] (psych inpatient stay).     5) Supportive care:   - listed as DNR on paperwork from [facility] today.   - patient's daughter, [name] is the best contact [phone]); spoke with her today (Dr. Sondra Come at facility is another contact, [phone] or Dr. Allena Katz at [phone] - hospital doctor, pt currently on hospital ward)    Due to this patient's diagnosis, he is at significant risk for subsequent morbidity and/or mortality.      Clarita Leber, MD      Interval History:  Since last seen here, he was hospitalized at his facility with diarrhea and dehydration. I spoke to local doctor and recommended to cont to hold idelalisib and I recommended to treat with weight based steroids (prednisone 60 mg daily). He continues to struggle with poor appetite and lack of taste. Weight down 5-10 more lbs.    Otherwise, he  denies new constitutional symptoms such as night sweats or unexplained fevers.  Furthermore, he denies symptoms of marrow failure: unexplained bleeding or bruising, recurrent or unexplained intercurrent infections, dyspnea on exertion, lightheadedness, palpitations or chest pain.  There have been no new or unexplained pains or self-identified masses, swelling or enlarged lymph nodes.    Past Medical, Surgical and Family History were reviewed and pertinent updates were made in the Electronic Medical Record        Oncology History:    Oncology History    CLL  Prognostic testing: 11q deletion present on karyotype and FISH from 11/15/12  IGHV: unmutated at 0.0%, 1-69*01    Treatment history:  Bendamustine-rituximab  Cytoxan-rituximab  Most recently on idelalisib-rituximab        CLL (chronic lymphocytic leukemia) (CMS-HCC)    08/23/2017 Initial Diagnosis     CLL (chronic lymphocytic leukemia) (CMS-HCC)            REVIEW OF SYSTEMS:   CONSTITUTIONAL: No fevers, chills, +weight loss, no night sweats.   HEENT: Eyes: No blurred vision. ENT: No earache, sore throat or runny nose.   CARDIOVASCULAR: No chest pain, palpitations.  RESPIRATORY: No shortness of breath, PND or orthopnea. Mild cough.  GASTROINTESTINAL: No nausea, vomiting or diarrhea.   GENITOURINARY: No dysuria, frequency or urgency.   MUSCULOSKELETAL: No muscle pain or weakness.  SKIN: No rashes or other skin complaints.  NEUROLOGIC: No headaches, paresthesias, fasciculations, seizures.  PSYCHIATRIC: + dementia   ENDOCRINE: No heat or cold intolerance, polyuria or polydipsia.   HEMATOLOGICAL: No easy bruising or bleeding.     Karn[place]sky Performance Status: 31 - Requiring some help, can take care of most personal requirements  ECOG Performance Status: 2 - Ambulatory and capable of all selfcare but unable to carry out any work activities. Up and about more than 50% of waking hours      Allergies   Allergen Reactions   ??? Bayer Aspirin [Aspirin] Diarrhea   ??? Ciprofloxacin Diarrhea   ??? Zoloft [Sertraline] Other (See Comments)     hyponatremia         Current Outpatient Medications   Medication Sig Dispense Refill   ??? acetaminophen (TYLENOL) 325 MG tablet Take 650 mg by mouth daily as needed for pain.     ??? acyclovir (ZOVIRAX) 400 MG tablet Take 1 tablet by mouth Two (2) times a day.  6   ??? ARIPiprazole (ABILIFY) 2 MG tablet Take 2 mg by mouth daily.     ??? aspirin (ECOTRIN) 81 MG tablet Take 81 mg by mouth daily.     ??? atorvastatin (LIPITOR) 10 MG tablet Take 10 mg by mouth daily.     ??? barium sulfate (READI-CAT 2) 2 % (w/v) Susp TAKE AS DIRECTED PER INSTRUCTION SHEET FOR CT SCAN     ??? brexpiprazole (REXULTI) 1 mg Tab Continuous.     ??? clonazePAM (KLONOPIN) 0.5 MG tablet Take 0.25 mg by mouth two (2) times a day as needed for anxiety.     ??? docusate sodium (DOK) 100 MG capsule Take 1 capsule by mouth Two (2) times a day.     ??? folic acid (FOLVITE) 1 MG tablet Take 1 mg by mouth daily.     ??? idelalisib (ZYDELIG) 150 mg Tab Take 150 mg by mouth Two (2) times a day.     ??? idelalisib 150 mg Tab Take 1 tablet (150 mg total) by mouth Two (2) times a day. (Patient not taking: Reported on 11/08/2017) 60  tablet 6   ??? lisinopril (PRINIVIL,ZESTRIL) 20 MG tablet Take 20 mg by mouth daily.     ??? loperamide (IMODIUM) 2 mg capsule Take 2 mg by mouth 4 (four) times a day as needed for diarrhea.     ??? loratadine (CLARITIN) 10 mg tablet Take 10 mg by mouth nightly.     ??? magnesium hydroxide (MAGNESIUM HYDROXIDE) 400 mg/5 mL Susp Take 30 mL by mouth daily as needed.     ??? magnesium oxide (MAG-OX) 400 mg (241.3 mg magnesium) tablet Take 400 mg by mouth Two (2) times a day.     ??? mirtazapine (REMERON) 30 MG tablet Take 30 mg by mouth nightly.     ??? multivitamin (TAB-A-VITE/THERAGRAN) per tablet Take 1 tablet by mouth daily.     ??? nitroglycerin (NITROSTAT) 0.4 MG SL tablet Place 0.4 mg under the tongue every five (5) minutes as needed for chest pain.     ??? omeprazole (PRILOSEC) 20 MG capsule Take 1 capsule by mouth Two (2) times a day.     ??? pantoprazole (PROTONIX) 40 MG tablet Take 40 mg by mouth daily.     ??? promethazine (PHENERGAN) 12.5 MG tablet Take 12.5 mg by mouth every eight (8) hours as needed for nausea.     ??? pseudoephedrine (SUDAFED) 30 MG tablet Take 30 mg by mouth daily as needed for congestion.     ??? simethicone (MYLICON) 80 MG chewable tablet Chew 80 mg every six (6) hours as needed for flatulence.     ??? tamsulosin (FLOMAX) 0.4 mg capsule Take 1 capsule by mouth daily.       No current facility-administered medications for this visit.      Physical exam:  Vitals:    11/08/17 1435   BP: 116/60   Pulse: 86   Resp: 16 Temp: 36.6 ??C (97.9 ??F)   SpO2: 98%     General: Resting in no apparent distress  HEENT:  Pupils are equal, round and reactive to light.  There is no scleral icterus and no conjunctival injection.  The oral mucosa does not demonstrate ulceration, erythema, exudate or purpura.  Nares show no bleeding.  No thyromegaly.  No jugular venous distention.    Lymph node exam:  No lymphadenopathy in the occipital, auricular, anterior/posterior cervical, submental, supraclavicular, axillary, inguinal regions.  Heart:  Regular rate and rhythm.  Normal S1 and S2 without S3 or S4.  There are no murmurs, gallops or rubs.  Lungs:  Breathing is unlabored and patient is speaking full sentences with ease.  No stridor.  Auscultation of lung fields reveals normal air movemen. Crackles present at the bases bilaterally.   Gastrointestinal:  No distention or pain on palpation.  Bowel sounds are present and normal in quality.  There is no palpable hepatomegaly or splenomegaly.  No palpable masses.  Skin:  No rashes, petechiae or purpura.  No areas of skin breakdown.  Musculoskeletal:  Pain with palpation of the 11th rib on the left, potential step off consistent with fracture (prior fall/injury). There are no grossly-evident joint effusions or deformities.  Range of motion about the shoulder, elbow, hips and knees is grossly normal.  There is no pain on palpation of the spinous processes of the cervical, thoracic or lumbar vertebral bodies.  Psychiatric:  Alert and oriented to person, not to place, time and situation.    Neurologic:  CN II-XII are normal and symmetric. Gait is normal. Otherwise neurologic exam is grossly non-focal.  Extremities:  Appear  well-perfused.  There is no clubbing, edema or cyanosis.    Orders/Results:  Labs and pathology have been reviewed and pertinent results are as follows:    Lab on 11/08/2017   Component Date Value   ??? Sodium 11/08/2017 134*   ??? Potassium 11/08/2017 5.1*   ??? Chloride 11/08/2017 97*   ??? CO2 11/08/2017 28.0    ??? BUN 11/08/2017 19    ??? Creatinine 11/08/2017 0.66*   ??? BUN/Creatinine Ratio 11/08/2017 29    ??? EGFR MDRD Non Af Amer 11/08/2017 >=60    ??? EGFR MDRD Af Amer 11/08/2017 >=60    ??? Anion Gap 11/08/2017 9    ??? Glucose 11/08/2017 121    ??? Calcium 11/08/2017 9.0    ??? Albumin 11/08/2017 3.5    ??? Total Protein 11/08/2017 5.3*   ??? Total Bilirubin 11/08/2017 0.4    ??? AST 11/08/2017 45    ??? ALT 11/08/2017 105*   ??? Alkaline Phosphatase 11/08/2017 90    ??? WBC 11/08/2017 12.5*   ??? RBC 11/08/2017 3.86*   ??? HGB 11/08/2017 12.0*   ??? HCT 11/08/2017 35.4*   ??? MCV 11/08/2017 91.8    ??? [facility] 11/08/2017 31.1    ??? MCHC 11/08/2017 33.9    ??? RDW 11/08/2017 17.9*   ??? MPV 11/08/2017 7.4    ??? Platelet 11/08/2017 218    ??? Neutrophils % 11/08/2017 66.2    ??? Lymphocytes % 11/08/2017 29.8    ??? Monocytes % 11/08/2017 2.7    ??? Eosinophils % 11/08/2017 0.1    ??? Basophils % 11/08/2017 0.2    ??? Neutrophil Left Shift 11/08/2017 1+*   ??? Absolute Neutrophils 11/08/2017 8.3*   ??? Absolute Lymphocytes 11/08/2017 3.7    ??? Absolute Monocytes 11/08/2017 0.3    ??? Absolute Eosinophils 11/08/2017 0.0    ??? Absolute Basophils 11/08/2017 0.0    ??? Large Unstained Cells 11/08/2017 1    ??? Macrocytosis 11/08/2017 Slight*   ??? Anisocytosis 11/08/2017 Slight*       Final Diagnosis   Date Value Ref Range Status   08/12/2017   Final    (Outisde Case #:  [id number], dated 04/13/2013)  Lymph node, right axillary, biopsy   -  Chronic lymphocytic leukemia / small lymphocytic lymphoma (See Comment)          (Outside Case #:  [id number], dated 11/15/2012)  Bone marrow, aspiration and biopsy    -  Hypercellular bone marrow (90%) with involvement by chronic lymphocytic leukemia, representing approximately 70% or marrow cellularity by CD20 immunohistochemical stain (See Comment)   -  By outside report, cytogenetic and FISH results are abnormal; see details in scanned documents.    IMAGING:   11/17/16: CT A/P  - minimal improvement in diffuse LAD in the abdomen.

## 2017-11-09 LAB — CMV VIRAL LD: Lab: DETECTED — AB

## 2017-11-09 LAB — CMV DNA, QUANTITATIVE, PCR: CMV QUANT: <50 [IU]/mL — ABNORMAL HIGH (ref <0)

## 2017-11-09 LAB — HEPATITIS B SURFACE ANTIBODY: Hepatitis B virus surface Ab:PrThr:Pt:Ser:Ord:: NONREACTIVE

## 2017-11-09 LAB — HEPATITIS C ANTIBODY: Hepatitis C virus Ab:PrThr:Pt:Ser:Ord:: NONREACTIVE

## 2017-11-09 LAB — HEPATITIS B SURFACE ANTIGEN: Hepatitis B virus surface Ag:PrThr:Pt:Ser:Ord:: NONREACTIVE

## 2017-11-09 LAB — HEPATITIS B CORE TOTAL ANTIBODY: Hepatitis B virus core Ab:PrThr:Pt:Ser/Plas:Ord:IA: NONREACTIVE

## 2017-11-23 ENCOUNTER — Other Ambulatory Visit: Admit: 2017-11-23 | Discharge: 2017-11-24 | Payer: MEDICARE

## 2017-11-23 ENCOUNTER — Ambulatory Visit: Admit: 2017-11-23 | Discharge: 2017-11-24 | Payer: MEDICARE

## 2017-11-23 ENCOUNTER — Ambulatory Visit: Admit: 2017-11-23 | Discharge: 2017-11-24 | Payer: MEDICARE | Attending: Adult Health | Primary: Adult Health

## 2017-11-23 DIAGNOSIS — C9112 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: Principal | ICD-10-CM

## 2017-11-23 DIAGNOSIS — D801 Nonfamilial hypogammaglobulinemia: Secondary | ICD-10-CM

## 2017-11-23 DIAGNOSIS — C9110 Chronic lymphocytic leukemia of B-cell type not having achieved remission: Secondary | ICD-10-CM

## 2017-11-23 LAB — CBC W/ AUTO DIFF
BASOPHILS ABSOLUTE COUNT: 0 10*9/L (ref 0.0–0.1)
BASOPHILS RELATIVE PERCENT: 0.2 %
EOSINOPHILS ABSOLUTE COUNT: 0.1 10*9/L (ref 0.0–0.4)
EOSINOPHILS RELATIVE PERCENT: 0.5 %
HEMOGLOBIN: 10.8 g/dL — ABNORMAL LOW (ref 13.5–17.5)
LARGE UNSTAINED CELLS: 2 % (ref 0–4)
LYMPHOCYTES ABSOLUTE COUNT: 3 10*9/L (ref 1.5–5.0)
LYMPHOCYTES RELATIVE PERCENT: 24.1 %
MEAN CORPUSCULAR HEMOGLOBIN CONC: 32.6 g/dL (ref 31.0–37.0)
MEAN CORPUSCULAR HEMOGLOBIN: 30.4 pg (ref 26.0–34.0)
MEAN CORPUSCULAR VOLUME: 93.1 fL (ref 80.0–100.0)
MONOCYTES ABSOLUTE COUNT: 0.7 10*9/L (ref 0.2–0.8)
MONOCYTES RELATIVE PERCENT: 5.2 %
NEUTROPHILS ABSOLUTE COUNT: 8.5 10*9/L — ABNORMAL HIGH (ref 2.0–7.5)
NEUTROPHILS RELATIVE PERCENT: 67.9 %
PLATELET COUNT: 352 10*9/L (ref 150–440)
RED BLOOD CELL COUNT: 3.55 10*12/L — ABNORMAL LOW (ref 4.50–5.90)
RED CELL DISTRIBUTION WIDTH: 16.6 % — ABNORMAL HIGH (ref 12.0–15.0)
WBC ADJUSTED: 12.6 10*9/L — ABNORMAL HIGH (ref 4.5–11.0)

## 2017-11-23 LAB — ALKALINE PHOSPHATASE: Alkaline phosphatase:CCnc:Pt:Ser/Plas:Qn:: 104

## 2017-11-23 LAB — COMPREHENSIVE METABOLIC PANEL
ALBUMIN: 2.9 g/dL — ABNORMAL LOW (ref 3.5–5.0)
ALKALINE PHOSPHATASE: 104 U/L (ref 38–126)
ALT (SGPT): 90 U/L — ABNORMAL HIGH (ref 19–72)
ANION GAP: 9 mmol/L (ref 9–15)
AST (SGOT): 55 U/L (ref 19–55)
BILIRUBIN TOTAL: 0.2 mg/dL (ref 0.0–1.2)
BLOOD UREA NITROGEN: 13 mg/dL (ref 7–21)
CALCIUM: 8.7 mg/dL (ref 8.5–10.2)
CO2: 26 mmol/L (ref 22.0–30.0)
CREATININE: 0.53 mg/dL — ABNORMAL LOW (ref 0.70–1.30)
EGFR MDRD AF AMER: >=60 mL/min/{1.73_m2} (ref >=60)
EGFR MDRD NON AF AMER: >=60 mL/min/{1.73_m2} (ref >=60)
GLUCOSE RANDOM: 128 mg/dL (ref 65–179)
PROTEIN TOTAL: 4.9 g/dL — ABNORMAL LOW (ref 6.5–8.3)
SODIUM: 135 mmol/L (ref 135–145)

## 2017-11-23 LAB — MONOCYTES RELATIVE PERCENT: Lab: 5.2

## 2017-11-23 MED ORDER — ALLOPURINOL 300 MG TABLET
Freq: Every day | ORAL | 0.00000 days
Start: 2017-11-23 — End: 2019-01-16

## 2017-11-23 MED ORDER — ONDANSETRON HCL 8 MG TABLET
Freq: Three times a day (TID) | ORAL | 0.00000 days | PRN
Start: 2017-11-23 — End: ?

## 2017-11-23 MED ORDER — PROCHLORPERAZINE MALEATE 10 MG TABLET
Freq: Four times a day (QID) | ORAL | 0.00000 days | PRN
Start: 2017-11-23 — End: 2018-08-01

## 2017-11-23 NOTE — Unmapped (Signed)
Follow up Visit Note    Patient Name: Eugene Carroll  Patient Age: 82 y.o.  Encounter Date: 11/23/2017    Chief complaint/Reason for visit: CLL    Assessment:  Eugene Carroll is a 82 y.o. male with a past medical history of CAD, CHF, Hx of CVA, HTN, HLD, and anxiety who presents for follow up of his CLL.     Mr. Delford Field is doing well overall.  He continues to have a cough and sinus drainage.  He does not report any fevers. His IgG level was drawn in 08/2017, which showed hypogammaglobulinemia.  Since then, he was admitted to the hospital ward for infection and dehydration and presents again today with a cough.  Given this persistent cold, will delay obinutuzumab for 2 weeks.  In the interim, will start IVIG next week, pending Medicare approval.     Per hospital records, he is off steroids.  He has started allopurinol in preparation for obi and will continue this.  His weight has increased by about 15 pounds in 2 weeks, with no significant evidence of fluid retention.  Mild crackles in right base.    I was unable to speak directly to his daughter [name], but directed her to call the nurse triage line so that we could call her back and update her on the plan.  I did speak to Dr. Allena Katz at [facility], and let him know the plan for IVIG and delaying the obinutuzumab.      Recommendations:    1. CLL, unmutated IGVH w/ del11q with recent weight loss  - see above  - delay obinutuzumab for 2 weeks  - continue allopurinol  - RTC in 1 week for IVIG, 2 weeks for obinutuzumab    2. Prophylaxis   - continue acyclovir     3. Diarrhea:   - resolved    4. Low IgG levels:   - IgG 275  - persistent cough - will initiate IVIG  - plan to start next week    5. Dementia:   - currently resides in [facility] (psych inpatient stay).     6. Supportive care:   - listed as DNR on paperwork from [facility] today.   - patient's daughter, [name] is the best contact [phone]); left message with her (Dr. Sondra Come at facility is another contact, [phone] or Dr. Allena Katz at [phone] - hospital doctor, pt currently on hospital ward)    Due to this patient's diagnosis, he is at significant risk for subsequent morbidity and/or mortality.        Interval History:  Since last seen here, He has done fair.  He reports he had another cold/runny nose since being here.  No fevers or hospitalization.  Diarrhea has resolved. Still with poor appetite, though he has gained about 14 pounds since last visit.  Does continue with mild cough, that he reports comes in spells.      Otherwise, he denies new constitutional symptoms such as night sweats or unexplained fevers.  Furthermore, he denies symptoms of marrow failure: unexplained bleeding or bruising, recurrent or unexplained intercurrent infections, dyspnea on exertion, lightheadedness, palpitations or chest pain.  There have been no new or unexplained pains or self-identified masses, swelling or enlarged lymph nodes.    Past Medical, Surgical and Family History were reviewed and pertinent updates were made in the Electronic Medical Record        Oncology History:    Oncology History    CLL  Prognostic testing: 11q deletion present  on karyotype and FISH from 11/15/12  IGHV: unmutated at 0.0%, 1-69*01    Treatment history:  Bendamustine-rituximab  Cytoxan-rituximab  Most recently on idelalisib-rituximab        CLL (chronic lymphocytic leukemia) (CMS-HCC)    08/23/2017 Initial Diagnosis     CLL (chronic lymphocytic leukemia) (CMS-HCC)         11/22/2017 -  Chemotherapy     Chemotherapy Treatment    Treatment Goal Curative   Line of Treatment [No plan line of treatment]   Plan Name OP CLL OBINUTUZUMAB   Start Date 11/22/2017   End Date 04/12/2018 (Planned)   Provider Pernell Dupre, MD   Chemotherapy dexamethasone (DECADRON) tablet 20 mg, 20 mg, Oral, Once, 0 of 6 cycles  obinutuzumab 100 mg in sodium chloride (NS) 0.9 % 250 mL IVPB, 100 mg, Intravenous, Once, 0 of 6 cycles              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)     11/08/2017 Initial Diagnosis     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      [place] Performance Status: 16 - Requiring some help, can take care of most personal requirements  ECOG Performance Status: 2 - Ambulatory and capable of all selfcare but unable to carry out any work activities. Up and about more than 50% of waking hours      Allergies   Allergen Reactions   ??? Bayer Aspirin [Aspirin] Diarrhea   ??? Ciprofloxacin Diarrhea   ??? Zoloft [Sertraline] Other (See Comments)     hyponatremia         Current Outpatient Medications   Medication Sig Dispense Refill   ??? acetaminophen (TYLENOL) 325 MG tablet Take 650 mg by mouth daily as needed for pain.     ??? acyclovir (ZOVIRAX) 400 MG tablet Take 1 tablet by mouth Two (2) times a day.  6   ??? ARIPiprazole (ABILIFY) 2 MG tablet Take 2 mg by mouth daily.     ??? aspirin (ECOTRIN) 81 MG tablet Take 81 mg by mouth daily.     ??? atorvastatin (LIPITOR) 10 MG tablet Take 10 mg by mouth daily.     ??? docusate sodium (DOK) 100 MG capsule Take 1 capsule by mouth Two (2) times a day.     ??? folic acid (FOLVITE) 1 MG tablet Take 1 mg by mouth daily.     ??? magnesium oxide (MAG-OX) 400 mg (241.3 mg magnesium) tablet Take 400 mg by mouth Two (2) times a day.     ??? mirtazapine (REMERON) 30 MG tablet Take 45 mg by mouth nightly.      ??? multivitamin (TAB-A-VITE/THERAGRAN) per tablet Take 1 tablet by mouth daily.     ???  pantoprazole (PROTONIX) 40 MG tablet Take 40 mg by mouth daily.     ??? tamsulosin (FLOMAX) 0.4 mg capsule Take 1 capsule by mouth daily.     ??? brexpiprazole (REXULTI) 1 mg Tab Continuous.     ??? clonazePAM (KLONOPIN) 0.5 MG tablet Take 0.25 mg by mouth two (2) times a day as needed for anxiety.     ??? lisinopril (PRINIVIL,ZESTRIL) 20 MG tablet Take 20 mg by mouth daily.     ??? loperamide (IMODIUM) 2 mg capsule Take 2 mg by mouth 4 (four) times a day as needed for diarrhea.     ??? loratadine (CLARITIN) 10 mg tablet Take 10 mg by mouth nightly.     ??? magnesium hydroxide (MAGNESIUM HYDROXIDE) 400 mg/5 mL Susp Take 30 mL by mouth daily as needed.     ??? nitroglycerin (NITROSTAT) 0.4 MG SL tablet Place 0.4 mg under the tongue every five (5) minutes as needed for chest pain.     ??? omeprazole (PRILOSEC) 20 MG capsule Take 1 capsule by mouth Two (2) times a day.     ??? promethazine (PHENERGAN) 12.5 MG tablet Take 12.5 mg by mouth every eight (8) hours as needed for nausea.     ??? pseudoephedrine (SUDAFED) 30 MG tablet Take 30 mg by mouth daily as needed for congestion.     ??? simethicone (MYLICON) 80 MG chewable tablet Chew 80 mg every six (6) hours as needed for flatulence.       No current facility-administered medications for this visit.      Physical exam:  Vitals:    11/23/17 1036   BP: 118/59   Pulse: 82   Resp: 16   Temp: 37.1 ??C (98.7 ??F)   SpO2: 97%     General: Resting in no apparent distress  HEENT:  Pupils are equal, round and reactive to light.  There is no scleral icterus and no conjunctival injection.  The oral mucosa does not demonstrate ulceration, erythema, exudate or purpura.  Nares show no bleeding.  No thyromegaly.  No jugular venous distention.    Lymph node exam:  No lymphadenopathy in the occipital, auricular, anterior/posterior cervical, submental, supraclavicular, axillary, inguinal regions.  Heart:  Regular rate and rhythm.  Normal S1 and S2 without S3 or S4.  There are no murmurs, gallops or rubs.  Lungs:  Breathing is unlabored and patient is speaking full sentences with ease.  No stridor.  Auscultation of lung fields reveals normal air movemen. Crackles present at the bases bilaterally.   Gastrointestinal:  No distention or pain on palpation.  Bowel sounds are present and normal in quality.  There is no palpable hepatomegaly or splenomegaly.  No palpable masses.  Skin:  No rashes, petechiae or purpura.  No areas of skin breakdown.  Musculoskeletal:  Pain with palpation of the 11th rib on the left, potential step off consistent with fracture (prior fall/injury). There are no grossly-evident joint effusions or deformities.  Range of motion about the shoulder, elbow, hips and knees is grossly normal.  There is no pain on palpation of the spinous processes of the cervical, thoracic or lumbar vertebral bodies.  Psychiatric:  Alert and oriented to person, not to place, time and situation.    Neurologic:  CN II-XII are normal and symmetric. Gait is normal. Otherwise neurologic exam is grossly non-focal.  Extremities:  Appear well-perfused.  There is no clubbing, edema or cyanosis.    Orders/Results:  Labs and pathology have been reviewed and pertinent results  are as follows:    Lab on 11/23/2017   Component Date Value   ??? Sodium 11/23/2017 135    ??? Potassium 11/23/2017 4.3    ??? Chloride 11/23/2017 100    ??? CO2 11/23/2017 26.0    ??? BUN 11/23/2017 13    ??? Creatinine 11/23/2017 0.53*   ??? BUN/Creatinine Ratio 11/23/2017 25    ??? EGFR MDRD Non Af Amer 11/23/2017 >=60    ??? EGFR MDRD Af Amer 11/23/2017 >=60    ??? Anion Gap 11/23/2017 9    ??? Glucose 11/23/2017 128    ??? Calcium 11/23/2017 8.7    ??? Albumin 11/23/2017 2.9*   ??? Total Protein 11/23/2017 4.9*   ??? Total Bilirubin 11/23/2017 0.2    ??? AST 11/23/2017 55    ??? ALT 11/23/2017 90*   ??? Alkaline Phosphatase 11/23/2017 104    ??? WBC 11/23/2017 12.6*   ??? RBC 11/23/2017 3.55*   ??? HGB 11/23/2017 10.8*   ??? HCT 11/23/2017 33.1*   ??? MCV 11/23/2017 93.1    ??? [facility] 11/23/2017 30.4    ??? MCHC 11/23/2017 32.6    ??? RDW 11/23/2017 16.6*   ??? MPV 11/23/2017 7.3    ??? Platelet 11/23/2017 352    ??? Neutrophils % 11/23/2017 67.9    ??? Lymphocytes % 11/23/2017 24.1    ??? Monocytes % 11/23/2017 5.2    ??? Eosinophils % 11/23/2017 0.5    ??? Basophils % 11/23/2017 0.2    ??? Absolute Neutrophils 11/23/2017 8.5*   ??? Absolute Lymphocytes 11/23/2017 3.0    ??? Absolute Monocytes 11/23/2017 0.7    ??? Absolute Eosinophils 11/23/2017 0.1    ??? Absolute Basophils 11/23/2017 0.0    ??? Large Unstained Cells 11/23/2017 2    ??? Macrocytosis 11/23/2017 Slight*   ??? Anisocytosis 11/23/2017 Slight*   ??? Hypochromasia 11/23/2017 Slight*

## 2017-11-23 NOTE — Unmapped (Signed)
PIV inserted on left forearm and labs drawn.  PIV flushed with saline.  Pt advised me after PIV was already placed that he has a port, therefore port was accessed & PIV removed.  Gauze and coban applied to site.  Pt ambulatory from the lab accompanied by his medical escort.

## 2017-11-30 ENCOUNTER — Other Ambulatory Visit: Admit: 2017-11-30 | Discharge: 2017-12-01 | Payer: MEDICARE

## 2017-11-30 ENCOUNTER — Ambulatory Visit: Admit: 2017-11-30 | Discharge: 2017-12-01 | Payer: MEDICARE

## 2017-11-30 DIAGNOSIS — D801 Nonfamilial hypogammaglobulinemia: Principal | ICD-10-CM

## 2017-11-30 DIAGNOSIS — C9112 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: Secondary | ICD-10-CM

## 2017-11-30 LAB — CBC W/ AUTO DIFF
BASOPHILS ABSOLUTE COUNT: 0.1 10*9/L (ref 0.0–0.1)
BASOPHILS RELATIVE PERCENT: 0.3 %
EOSINOPHILS ABSOLUTE COUNT: 0.1 10*9/L (ref 0.0–0.4)
EOSINOPHILS RELATIVE PERCENT: 0.3 %
HEMATOCRIT: 30.8 % — ABNORMAL LOW (ref 41.0–53.0)
HEMOGLOBIN: 9.8 g/dL — ABNORMAL LOW (ref 13.5–17.5)
LARGE UNSTAINED CELLS: 2 % (ref 0–4)
LYMPHOCYTES ABSOLUTE COUNT: 3.9 10*9/L (ref 1.5–5.0)
LYMPHOCYTES RELATIVE PERCENT: 24.2 %
MEAN CORPUSCULAR HEMOGLOBIN CONC: 31.8 g/dL (ref 31.0–37.0)
MEAN CORPUSCULAR HEMOGLOBIN: 30 pg (ref 26.0–34.0)
MEAN CORPUSCULAR VOLUME: 94.3 fL (ref 80.0–100.0)
MEAN PLATELET VOLUME: 7.4 fL (ref 7.0–10.0)
MONOCYTES ABSOLUTE COUNT: 0.7 10*9/L (ref 0.2–0.8)
MONOCYTES RELATIVE PERCENT: 4.4 %
NEUTROPHILS ABSOLUTE COUNT: 11.2 10*9/L — ABNORMAL HIGH (ref 2.0–7.5)
NEUTROPHILS RELATIVE PERCENT: 68.9 %
PLATELET COUNT: 369 10*9/L (ref 150–440)
RED CELL DISTRIBUTION WIDTH: 16.4 % — ABNORMAL HIGH (ref 12.0–15.0)
WBC ADJUSTED: 16.2 10*9/L — ABNORMAL HIGH (ref 4.5–11.0)

## 2017-11-30 LAB — COMPREHENSIVE METABOLIC PANEL
ALBUMIN: 2.8 g/dL — ABNORMAL LOW (ref 3.5–5.0)
ALKALINE PHOSPHATASE: 100 U/L (ref 38–126)
ALT (SGPT): 59 U/L (ref 19–72)
ANION GAP: 12 mmol/L (ref 9–15)
AST (SGOT): 50 U/L (ref 19–55)
BILIRUBIN TOTAL: 0.3 mg/dL (ref 0.0–1.2)
BLOOD UREA NITROGEN: 14 mg/dL (ref 7–21)
CALCIUM: 8.1 mg/dL — ABNORMAL LOW (ref 8.5–10.2)
CHLORIDE: 101 mmol/L (ref 98–107)
CO2: 23 mmol/L (ref 22.0–30.0)
CREATININE: 0.56 mg/dL — ABNORMAL LOW (ref 0.70–1.30)
EGFR MDRD AF AMER: >=60 mL/min/{1.73_m2} (ref >=60)
EGFR MDRD NON AF AMER: >=60 mL/min/{1.73_m2} (ref >=60)
GLUCOSE RANDOM: 147 mg/dL (ref 65–179)
POTASSIUM: 4.3 mmol/L (ref 3.5–5.0)
PROTEIN TOTAL: 4.8 g/dL — ABNORMAL LOW (ref 6.5–8.3)
SODIUM: 136 mmol/L (ref 135–145)

## 2017-11-30 LAB — CHLORIDE: Chloride:SCnc:Pt:Ser/Plas:Qn:: 101

## 2017-11-30 LAB — SMEAR REVIEW

## 2017-11-30 LAB — RED CELL DISTRIBUTION WIDTH: Lab: 16.4 — ABNORMAL HIGH

## 2017-11-30 NOTE — Unmapped (Signed)
Results for Eugene Carroll (MRN [id number]) as of 11/30/2017 16:07   Ref. Range 11/30/2017 10:42   WBC Latest Ref Range: 4.5 - 11.0 10*9/L 16.2 (H)   RBC Latest Ref Range: 4.50 - 5.90 10*12/L 3.27 (L)   HGB Latest Ref Range: 13.5 - 17.5 g/dL 9.8 (L)   HCT Latest Ref Range: 41.0 - 53.0 % 30.8 (L)   MCV Latest Ref Range: 80.0 - 100.0 fL 94.3   MCH Latest Ref Range: 26.0 - 34.0 pg 30.0   MCHC Latest Ref Range: 31.0 - 37.0 g/dL 47.4   RDW Latest Ref Range: 12.0 - 15.0 % 16.4 (H)   MPV Latest Ref Range: 7.0 - 10.0 fL 7.4   Platelet Latest Ref Range: 150 - 440 10*9/L 369   Neutrophils % Latest Units: % 68.9   Lymphocytes % Latest Units: % 24.2   Monocytes % Latest Units: % 4.4   Eosinophils % Latest Units: % 0.3   Basophils % Latest Units: % 0.3   Absolute Neutrophils Latest Ref Range: 2.0 - 7.5 10*9/L 11.2 (H)   Absolute Lymphocytes Latest Ref Range: 1.5 - 5.0 10*9/L 3.9   Absolute Monocytes  Latest Ref Range: 0.2 - 0.8 10*9/L 0.7   Absolute Eosinophils Latest Ref Range: 0.0 - 0.4 10*9/L 0.1   Absolute Basophils  Latest Ref Range: 0.0 - 0.1 10*9/L 0.1   Macrocytosis Latest Ref Range: Not Present  Slight (A)   Anisocytosis Latest Ref Range: Not Present  Slight (A)   Hypochromasia Latest Ref Range: Not Present  Marked (A)   Smear Review Latest Ref Range: Undefined  See Comment (A)   Large Unstained Cells Latest Ref Range: 0 - 4 % 2   Sodium Latest Ref Range: 135 - 145 mmol/L 136   Potassium Latest Ref Range: 3.5 - 5.0 mmol/L 4.3   Chloride Latest Ref Range: 98 - 107 mmol/L 101   CO2 Latest Ref Range: 22.0 - 30.0 mmol/L 23.0   Bun Latest Ref Range: 7 - 21 mg/dL 14   Creatinine Latest Ref Range: 0.70 - 1.30 mg/dL 2.59 (L)   BUN/Creatinine Ratio Unknown 25   EGFR MDRD Non Af Amer Latest Ref Range: >=60 mL/min/1.78m2 >=60   EGFR MDRD Af Amer Latest Ref Range: >=60 mL/min/1.78m2 >=60   Anion Gap Latest Ref Range: 9 - 15 mmol/L 12   Glucose Latest Ref Range: 65 - 179 mg/dL 563   Calcium Latest Ref Range: 8.5 - 10.2 mg/dL 8.1 (L) Albumin Latest Ref Range: 3.5 - 5.0 g/dL 2.8 (L)   Total Protein Latest Ref Range: 6.5 - 8.3 g/dL 4.8 (L)   Total Bilirubin Latest Ref Range: 0.0 - 1.2 mg/dL 0.3   AST Latest Ref Range: 19 - 55 U/L 50   ALT Latest Ref Range: 19 - 72 U/L 59   Alkaline Phosphatase Latest Ref Range: 38 - 126 U/L 100     If you feel like you're having an emergency please call 911.  For appointments or questions Monday through Friday 8AM-5PM please call [phone] or Toll Free [phone]. For Medical questions or concerns ask for the Nurse Triage Line.  On Nights, Weekends, and Holidays call [phone] and ask for the Oncologist on Call.  Reasons to call the Nurse Triage Line:  Fever of 100.5 or greater  Nausea and/or vomiting not relived with nausea medicine  Diarrhea or constipation  Severe pain not relieved with usual pain regimen  Shortness of breath  Uncontrolled bleeding  Mental status changes

## 2017-11-30 NOTE — Unmapped (Signed)
1035:  Labs drawn and sent for analysis.  Care provided by  Eppie Gibson, RN

## 2017-11-30 NOTE — Unmapped (Signed)
Pt presents in NAD, no complaints, pain free, no negative changes since last visit. Port accessed prior to arrival to infusion center.+blood return. Given premeds by R. Rabenberg RN. Pt has attendant with him as he is here from mental health facility at [facility].    1614 Port de-accessed after 500 units of heparin instilled pt tolerated infusion without difficulty. NAD, no questions, no complaints at time of discharge,  d/c instructions given to caregiver. Pt left infusion center ambulatory, steady gait.

## 2017-12-07 ENCOUNTER — Ambulatory Visit: Admit: 2017-12-07 | Discharge: 2017-12-07 | Payer: MEDICARE | Attending: Adult Health | Primary: Adult Health

## 2017-12-07 ENCOUNTER — Other Ambulatory Visit: Admit: 2017-12-07 | Discharge: 2017-12-07 | Payer: MEDICARE

## 2017-12-07 ENCOUNTER — Ambulatory Visit: Admit: 2017-12-07 | Discharge: 2017-12-07 | Payer: MEDICARE

## 2017-12-07 DIAGNOSIS — C9112 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: Principal | ICD-10-CM

## 2017-12-07 DIAGNOSIS — D801 Nonfamilial hypogammaglobulinemia: Secondary | ICD-10-CM

## 2017-12-07 DIAGNOSIS — C9110 Chronic lymphocytic leukemia of B-cell type not having achieved remission: Secondary | ICD-10-CM

## 2017-12-07 LAB — CBC W/ AUTO DIFF
BASOPHILS ABSOLUTE COUNT: 0 10*9/L (ref 0.0–0.1)
BASOPHILS RELATIVE PERCENT: 0.3 %
EOSINOPHILS ABSOLUTE COUNT: 0.1 10*9/L (ref 0.0–0.4)
EOSINOPHILS RELATIVE PERCENT: 0.6 %
HEMATOCRIT: 32.3 % — ABNORMAL LOW (ref 41.0–53.0)
HEMOGLOBIN: 10.4 g/dL — ABNORMAL LOW (ref 13.5–17.5)
LARGE UNSTAINED CELLS: 2 % (ref 0–4)
LYMPHOCYTES ABSOLUTE COUNT: 3.7 10*9/L (ref 1.5–5.0)
LYMPHOCYTES RELATIVE PERCENT: 23.1 %
MEAN CORPUSCULAR HEMOGLOBIN CONC: 32.2 g/dL (ref 31.0–37.0)
MEAN CORPUSCULAR HEMOGLOBIN: 29.7 pg (ref 26.0–34.0)
MEAN PLATELET VOLUME: 7.2 fL (ref 7.0–10.0)
MONOCYTES RELATIVE PERCENT: 4.6 %
NEUTROPHILS ABSOLUTE COUNT: 11.1 10*9/L — ABNORMAL HIGH (ref 2.0–7.5)
NEUTROPHILS RELATIVE PERCENT: 69.2 %
PLATELET COUNT: 444 10*9/L — ABNORMAL HIGH (ref 150–440)
RED BLOOD CELL COUNT: 3.51 10*12/L — ABNORMAL LOW (ref 4.50–5.90)
RED CELL DISTRIBUTION WIDTH: 15.8 % — ABNORMAL HIGH (ref 12.0–15.0)
WBC ADJUSTED: 16 10*9/L — ABNORMAL HIGH (ref 4.5–11.0)

## 2017-12-07 LAB — COMPREHENSIVE METABOLIC PANEL
ALBUMIN: 3.3 g/dL — ABNORMAL LOW (ref 3.5–5.0)
ALKALINE PHOSPHATASE: 109 U/L (ref 38–126)
ALT (SGPT): 72 U/L (ref 19–72)
ANION GAP: 10 mmol/L (ref 9–15)
AST (SGOT): 64 U/L — ABNORMAL HIGH (ref 19–55)
BILIRUBIN TOTAL: 0.3 mg/dL (ref 0.0–1.2)
BUN / CREAT RATIO: 25
CALCIUM: 9.1 mg/dL (ref 8.5–10.2)
CHLORIDE: 100 mmol/L (ref 98–107)
CO2: 26 mmol/L (ref 22.0–30.0)
EGFR MDRD AF AMER: >=60 mL/min/{1.73_m2} (ref >=60)
EGFR MDRD NON AF AMER: >=60 mL/min/{1.73_m2} (ref >=60)
GLUCOSE RANDOM: 128 mg/dL (ref 65–179)
POTASSIUM: 4.4 mmol/L (ref 3.5–5.0)
PROTEIN TOTAL: 6.1 g/dL — ABNORMAL LOW (ref 6.5–8.3)

## 2017-12-07 LAB — GAMMAGLOBULIN; IGG: IgG:MCnc:Pt:Ser/Plas:Qn:: 470 — ABNORMAL LOW

## 2017-12-07 LAB — MEAN CORPUSCULAR HEMOGLOBIN CONC: Lab: 32.2

## 2017-12-07 LAB — BLOOD UREA NITROGEN: Urea nitrogen:MCnc:Pt:Ser/Plas:Qn:: 15

## 2017-12-07 NOTE — Unmapped (Signed)
Patient labs drawn and sent for analysis. Care per Mickeal Needy RN

## 2017-12-07 NOTE — Unmapped (Signed)
[facility] Follow Up       Patient Name: Eugene Carroll  Patient Age: 82 y.o.  Encounter Date: 12/07/2017    Chief complaint/Reason for visit: CLL    Assessment:  Eugene Carroll is a 82 y.o. male with a past medical history of CAD, CHF, Hx of CVA, HTN, HLD, and anxiety who presents for follow up of his CLL.     Mr. Delford Field is doing well overall.  Continues to have a cold and a cough.  Spoke with Dr. Sondra Come at [facility] who reported the patient got a CXR 5/20 for chronic cough which showed bibasilar atelectasis.  Exam here on 5/22 revealed clear except for crackles in the bases.  Today, he's got rhonchi in his right middle and lower lobes, a clear change from previous visit and recommend repeat CXR. Recommended to Dr. Sondra Come treatment with levofloxacin 750mg  PO daily x 10 days.  She will write for this.  IgG level today 470. Got IVIG infusion last week, which did increase his IgG level.  Will have him back for a monthly infusion in 3 weeks.      After discussion with Dr. Lonni Fix, will delay obinutuzumab for one month to try to resolve the infection.  She feels his disease is not progressing quickly and can take the time to recover.  Will return in 4 weeks.    I was unable to speak directly to his daughter [name], and left her a message with an update.        Recommendations:    1. CLL, unmutated IGVH w/ del11q with recent weight loss  - see above  - delay obinutuzumab for 1 month  - continue allopurinol  - RTC in 3 weeks for IVIG  - RTC in 4 weeks for Dr. Lonni Fix and obinutuzumab    2. Prophylaxis   - continue acyclovir     3. Low IgG levels:   - IgG 470 today  - return for IVIG in 3 weeks    4. Suspected bronchitis/PNA  - recommend CXR at [facility]  - treat with levofloxacin 750mg  PO daily x 10 days    5. Dementia:   - currently resides in [facility] (psych inpatient stay).     6. Supportive care:   - listed as DNR on paperwork from [facility] today.   - patient's daughter, [name] is the best contact [phone]); left message with her (Dr. Sondra Come at facility is another contact, [phone] or Dr. Allena Katz at [phone] - hospital doctor, pt currently on hospital ward)    Due to this patient's diagnosis, he is at significant risk for subsequent morbidity and/or mortality.        Interval History:  Doing fair since being seen.  Continues to have a cold, with a runny nose and reports yellow sputum.  Continues to have a cough, which fluctuates between productive and non productive.  No fevers or hospitalization.  Diarrhea has resolved. Still with poor appetite, though he continues to gain weight.     Otherwise, he denies new constitutional symptoms such as night sweats or unexplained fevers.  Furthermore, he denies symptoms of marrow failure: unexplained bleeding or bruising, recurrent or unexplained intercurrent infections, dyspnea on exertion, lightheadedness, palpitations or chest pain.  There have been no new or unexplained pains or self-identified masses, swelling or enlarged lymph nodes.    Past Medical, Surgical and Family History were reviewed and pertinent updates were made in the Electronic Medical Record  Oncology History:    Oncology History    CLL  Prognostic testing: 11q deletion present on karyotype and FISH from 11/15/12  IGHV: unmutated at 0.0%, 1-69*01    Treatment history:  Bendamustine-rituximab  Cytoxan-rituximab  Most recently on idelalisib-rituximab        CLL (chronic lymphocytic leukemia) (CMS-HCC)    08/23/2017 Initial Diagnosis     CLL (chronic lymphocytic leukemia) (CMS-HCC)         11/22/2017 -  Chemotherapy     Chemotherapy Treatment    Treatment Goal Curative   Line of Treatment [No plan line of treatment]   Plan Name OP CLL OBINUTUZUMAB   Start Date 11/22/2017   End Date 05/23/2018 (Planned)   Provider Pernell Dupre, MD   Chemotherapy dexamethasone (DECADRON) tablet 20 mg, 20 mg, Oral, Once, 0 of 6 cycles  obinutuzumab 100 mg in sodium chloride (NS) 0.9 % 250 mL IVPB, 100 mg, Intravenous, Once, 0 of 6 cycles              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)     11/08/2017 Initial Diagnosis     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      [place] Performance Status: 22 - Requiring some help, can take care of most personal requirements  ECOG Performance Status: 2 - Ambulatory and capable of all selfcare but unable to carry out any work activities. Up and about more than 50% of waking hours      Allergies   Allergen Reactions   ??? Bayer Aspirin [Aspirin] Diarrhea   ??? Ciprofloxacin Diarrhea   ??? Zoloft [Sertraline] Other (See Comments)     hyponatremia         Current Outpatient Medications   Medication Sig Dispense Refill   ??? acetaminophen (TYLENOL) 325 MG tablet Take 650 mg by mouth daily as needed for pain.     ??? acyclovir (ZOVIRAX) 400 MG tablet Take 1 tablet by mouth Two (2) times a day.  6   ??? allopurinol (ZYLOPRIM) 300 MG tablet Take 1 tablet (300 mg total) by mouth daily.     ??? ARIPiprazole (ABILIFY) 2 MG tablet Take 2 mg by mouth daily.     ??? aspirin (ECOTRIN) 81 MG tablet Take 81 mg by mouth daily.     ??? atorvastatin (LIPITOR) 10 MG tablet Take 10 mg by mouth daily.     ??? brexpiprazole (REXULTI) 1 mg Tab Continuous.     ??? clonazePAM (KLONOPIN) 0.5 MG tablet Take 0.25 mg by mouth two (2) times a day as needed for anxiety.     ??? docusate sodium (DOK) 100 MG capsule Take 1 capsule by mouth Two (2) times a day.     ???  folic acid (FOLVITE) 1 MG tablet Take 1 mg by mouth daily.     ??? lisinopril (PRINIVIL,ZESTRIL) 20 MG tablet Take 20 mg by mouth daily.     ??? loperamide (IMODIUM) 2 mg capsule Take 2 mg by mouth 4 (four) times a day as needed for diarrhea.     ??? loratadine (CLARITIN) 10 mg tablet Take 10 mg by mouth nightly.     ??? magnesium hydroxide (MAGNESIUM HYDROXIDE) 400 mg/5 mL Susp Take 30 mL by mouth daily as needed.     ??? magnesium oxide (MAG-OX) 400 mg (241.3 mg magnesium) tablet Take 400 mg by mouth Two (2) times a day.     ??? mirtazapine (REMERON) 30 MG tablet Take 45 mg by mouth nightly.      ??? multivitamin (TAB-A-VITE/THERAGRAN) per tablet Take 1 tablet by mouth daily.     ??? nitroglycerin (NITROSTAT) 0.4 MG SL tablet Place 0.4 mg under the tongue every five (5) minutes as needed for chest pain.     ??? omeprazole (PRILOSEC) 20 MG capsule Take 1 capsule by mouth Two (2) times a day.     ??? ondansetron (ZOFRAN) 8 MG tablet Take 1 tablet (8 mg total) by mouth every eight (8) hours as needed for nausea.     ??? pantoprazole (PROTONIX) 40 MG tablet Take 40 mg by mouth daily.     ??? prochlorperazine (COMPAZINE) 10 MG tablet Take 1 tablet (10 mg total) by mouth every six (6) hours as needed for nausea (or vomiting).     ??? promethazine (PHENERGAN) 12.5 MG tablet Take 12.5 mg by mouth every eight (8) hours as needed for nausea.     ??? pseudoephedrine (SUDAFED) 30 MG tablet Take 30 mg by mouth daily as needed for congestion.     ??? simethicone (MYLICON) 80 MG chewable tablet Chew 80 mg every six (6) hours as needed for flatulence.     ??? tamsulosin (FLOMAX) 0.4 mg capsule Take 1 capsule by mouth daily.       No current facility-administered medications for this visit.      Facility-Administered Medications Ordered in Other Visits   Medication Dose Route Frequency Provider Last Rate Last Dose   ??? heparin, porcine (PF) 100 unit/mL injection 500 Units  500 Units Intravenous Q30 Min PRN Pernell Dupre, MD   500 Units at 12/07/17 1610     Physical exam:  Vitals:    12/07/17 0942   BP: 116/61   Pulse: 82   Resp: 16   Temp: 36.3 ??C (97.3 ??F)   SpO2: 97%     General: Resting in no apparent distress  HEENT:  There is no scleral icterus and no conjunctival injection.  The oral mucosa does not demonstrate ulceration, erythema, exudate or purpura.  Nares show no bleeding.  No thyromegaly.  No jugular venous distention.    Lymphatic:  No lymphadenopathy in the occipital, auricular, anterior/posterior cervical, submental, supraclavicular, axillary, inguinal regions.  CV:  Regular rate and rhythm.  Normal S1 and S2 without S3 or S4.  There are no murmurs, gallops or rubs.  Resp:  Breathing is unlabored and patient is speaking full sentences with ease.  No stridor.  Rhochi in right middle and lower lobe.  Gastrointestinal:  No distention or pain on palpation.  Bowel sounds are present and normal in quality.  There is no palpable hepatomegaly or splenomegaly.  No palpable masses.  Skin:  No rashes, petechiae or purpura.  No areas of skin breakdown.  Musculoskeletal:  There are no grossly-evident joint effusions or deformities.  Range of motion about the shoulder, elbow, hips and knees is grossly normal.  There is no pain on palpation of the spinous processes of the cervical, thoracic or lumbar vertebral bodies.  Psychiatric:  Alert and oriented to person, not to place, time and situation.    Neurologic:  CN II-XII are normal and symmetric. Gait is normal. Otherwise neurologic exam is grossly non-focal.  Extremities:  Appear well-perfused.  There is no clubbing, edema or cyanosis.    Orders/Results:  Labs and pathology have been reviewed and pertinent results are as follows:    Office Visit on 12/07/2017   Component Date Value   ??? Total IgG 12/07/2017 470*   Lab on 12/07/2017   Component Date Value   ??? Sodium 12/07/2017 136    ??? Potassium 12/07/2017 4.4    ??? Chloride 12/07/2017 100    ??? CO2 12/07/2017 26.0 ??? BUN 12/07/2017 15    ??? Creatinine 12/07/2017 0.60*   ??? BUN/Creatinine Ratio 12/07/2017 25    ??? EGFR MDRD Non Af Amer 12/07/2017 >=60    ??? EGFR MDRD Af Amer 12/07/2017 >=60    ??? Anion Gap 12/07/2017 10    ??? Glucose 12/07/2017 128    ??? Calcium 12/07/2017 9.1    ??? Albumin 12/07/2017 3.3*   ??? Total Protein 12/07/2017 6.1*   ??? Total Bilirubin 12/07/2017 0.3    ??? AST 12/07/2017 64*   ??? ALT 12/07/2017 72    ??? Alkaline Phosphatase 12/07/2017 109    ??? WBC 12/07/2017 16.0*   ??? RBC 12/07/2017 3.51*   ??? HGB 12/07/2017 10.4*   ??? HCT 12/07/2017 32.3*   ??? MCV 12/07/2017 92.1    ??? [facility] 12/07/2017 29.7    ??? MCHC 12/07/2017 32.2    ??? RDW 12/07/2017 15.8*   ??? MPV 12/07/2017 7.2    ??? Platelet 12/07/2017 444*   ??? Variable HGB Concentrati* 12/07/2017 Slight*   ??? Neutrophils % 12/07/2017 69.2    ??? Lymphocytes % 12/07/2017 23.1    ??? Monocytes % 12/07/2017 4.6    ??? Eosinophils % 12/07/2017 0.6    ??? Basophils % 12/07/2017 0.3    ??? Absolute Neutrophils 12/07/2017 11.1*   ??? Absolute Lymphocytes 12/07/2017 3.7    ??? Absolute Monocytes 12/07/2017 0.7    ??? Absolute Eosinophils 12/07/2017 0.1    ??? Absolute Basophils 12/07/2017 0.0    ??? Large Unstained Cells 12/07/2017 2    ??? Macrocytosis 12/07/2017 Slight*   ??? Hypochromasia 12/07/2017 Marked*

## 2017-12-09 LAB — CMV DNA, QUANTITATIVE, PCR
CMV QUANT: <50 [IU]/mL — ABNORMAL HIGH (ref <0)
CMV VIRAL LD: DETECTED — AB

## 2017-12-09 LAB — CMV COMMENT: Lab: 0

## 2017-12-28 ENCOUNTER — Ambulatory Visit: Admit: 2017-12-28 | Discharge: 2017-12-28 | Payer: MEDICARE

## 2017-12-28 ENCOUNTER — Other Ambulatory Visit: Admit: 2017-12-28 | Discharge: 2017-12-28 | Payer: MEDICARE

## 2017-12-28 DIAGNOSIS — D801 Nonfamilial hypogammaglobulinemia: Principal | ICD-10-CM

## 2017-12-28 DIAGNOSIS — C9112 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: Secondary | ICD-10-CM

## 2017-12-28 LAB — GAMMAGLOBULIN; IGG: IgG:MCnc:Pt:Ser/Plas:Qn:: 396 — ABNORMAL LOW

## 2017-12-28 NOTE — Unmapped (Signed)
If you feel like this is an emergency please call 911.  For appointments or questions Monday through Friday 8AM-5PM please call [phone] or Toll Free [phone]. For Medical questions or concerns ask for the Nurse Triage Line.  On Nights, Weekends, and Holidays call [phone] and ask for the Oncologist on Call.  Reasons to call the Nurse Triage Line:  Fever of 100.5 or greater  Nausea and/or vomiting not relieved with nausea medicine  Diarrhea or constipation  Severe pain not relieved with usual pain regimen  Shortness of breath  Uncontrolled bleeding  Mental status changes    Lab on 12/28/2017   Component Date Value Ref Range Status   ??? Total IgG 12/28/2017 396* 600-1,700 mg/dL Final

## 2017-12-28 NOTE — Unmapped (Signed)
Port was accessed and labs were drawn and sent for analysis.

## 2017-12-28 NOTE — Unmapped (Signed)
0925 Pt here for IVIG. Reports no issues. Pre meds given.   1009 IVIG started. Rate changed throughout infusion per protocol. Pt tolerated well. Completed at 1157. No complications noted.   1205 Port flushed; heparin locked and deaccessed. Pt discharged in NAD.

## 2018-01-03 ENCOUNTER — Ambulatory Visit: Admit: 2018-01-03 | Discharge: 2018-01-03 | Payer: MEDICARE

## 2018-01-03 ENCOUNTER — Other Ambulatory Visit: Admit: 2018-01-03 | Discharge: 2018-01-03 | Payer: MEDICARE

## 2018-01-03 ENCOUNTER — Ambulatory Visit
Admit: 2018-01-03 | Discharge: 2018-01-03 | Payer: MEDICARE | Attending: Hematology & Oncology | Primary: Hematology & Oncology

## 2018-01-03 DIAGNOSIS — C9110 Chronic lymphocytic leukemia of B-cell type not having achieved remission: Secondary | ICD-10-CM

## 2018-01-03 DIAGNOSIS — C9112 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: Secondary | ICD-10-CM

## 2018-01-04 ENCOUNTER — Ambulatory Visit
Admit: 2018-01-04 | Discharge: 2018-01-04 | Payer: MEDICARE | Attending: Student in an Organized Health Care Education/Training Program | Primary: Student in an Organized Health Care Education/Training Program

## 2018-01-04 ENCOUNTER — Other Ambulatory Visit: Admit: 2018-01-04 | Discharge: 2018-01-04 | Payer: MEDICARE

## 2018-01-04 DIAGNOSIS — C9112 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: Principal | ICD-10-CM

## 2018-01-04 DIAGNOSIS — C9110 Chronic lymphocytic leukemia of B-cell type not having achieved remission: Secondary | ICD-10-CM

## 2018-01-10 ENCOUNTER — Other Ambulatory Visit: Admit: 2018-01-10 | Discharge: 2018-01-10 | Payer: MEDICARE

## 2018-01-10 ENCOUNTER — Ambulatory Visit: Admit: 2018-01-10 | Discharge: 2018-01-10 | Payer: MEDICARE

## 2018-01-10 DIAGNOSIS — C9112 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: Principal | ICD-10-CM

## 2018-01-10 DIAGNOSIS — C9110 Chronic lymphocytic leukemia of B-cell type not having achieved remission: Secondary | ICD-10-CM

## 2018-02-01 ENCOUNTER — Other Ambulatory Visit: Admit: 2018-02-01 | Discharge: 2018-02-02 | Payer: MEDICARE

## 2018-02-01 ENCOUNTER — Ambulatory Visit: Admit: 2018-02-01 | Discharge: 2018-02-02 | Payer: MEDICARE

## 2018-02-01 ENCOUNTER — Ambulatory Visit: Admit: 2018-02-01 | Discharge: 2018-02-02 | Payer: MEDICARE | Attending: Adult Health | Primary: Adult Health

## 2018-02-01 DIAGNOSIS — C9110 Chronic lymphocytic leukemia of B-cell type not having achieved remission: Principal | ICD-10-CM

## 2018-02-01 DIAGNOSIS — D801 Nonfamilial hypogammaglobulinemia: Secondary | ICD-10-CM

## 2018-02-01 DIAGNOSIS — C9112 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: Principal | ICD-10-CM

## 2018-02-01 DIAGNOSIS — F0391 Unspecified dementia with behavioral disturbance: Secondary | ICD-10-CM

## 2018-03-01 ENCOUNTER — Ambulatory Visit: Admit: 2018-03-01 | Discharge: 2018-03-01 | Payer: MEDICARE | Attending: Adult Health | Primary: Adult Health

## 2018-03-01 ENCOUNTER — Other Ambulatory Visit: Admit: 2018-03-01 | Discharge: 2018-03-01 | Payer: MEDICARE

## 2018-03-01 ENCOUNTER — Ambulatory Visit: Admit: 2018-03-01 | Discharge: 2018-03-01 | Payer: MEDICARE

## 2018-03-01 DIAGNOSIS — C9110 Chronic lymphocytic leukemia of B-cell type not having achieved remission: Secondary | ICD-10-CM

## 2018-03-01 DIAGNOSIS — C9112 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: Principal | ICD-10-CM

## 2018-03-01 DIAGNOSIS — D801 Nonfamilial hypogammaglobulinemia: Secondary | ICD-10-CM

## 2018-03-16 ENCOUNTER — Ambulatory Visit: Admit: 2018-03-16 | Discharge: 2018-03-17 | Payer: MEDICARE | Attending: Audiologist | Primary: Audiologist

## 2018-03-16 DIAGNOSIS — H903 Sensorineural hearing loss, bilateral: Secondary | ICD-10-CM

## 2018-03-16 DIAGNOSIS — H905 Unspecified sensorineural hearing loss: Secondary | ICD-10-CM

## 2018-03-16 DIAGNOSIS — H9190 Unspecified hearing loss, unspecified ear: Principal | ICD-10-CM

## 2018-03-29 ENCOUNTER — Other Ambulatory Visit: Admit: 2018-03-29 | Discharge: 2018-03-29 | Payer: MEDICARE

## 2018-03-29 ENCOUNTER — Ambulatory Visit: Admit: 2018-03-29 | Discharge: 2018-03-29 | Payer: MEDICARE | Attending: Adult Health | Primary: Adult Health

## 2018-03-29 ENCOUNTER — Ambulatory Visit: Admit: 2018-03-29 | Discharge: 2018-03-29 | Payer: MEDICARE

## 2018-03-29 DIAGNOSIS — C9112 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: Principal | ICD-10-CM

## 2018-03-29 DIAGNOSIS — C9110 Chronic lymphocytic leukemia of B-cell type not having achieved remission: Secondary | ICD-10-CM

## 2018-03-29 DIAGNOSIS — D801 Nonfamilial hypogammaglobulinemia: Secondary | ICD-10-CM

## 2018-04-25 ENCOUNTER — Other Ambulatory Visit: Admit: 2018-04-25 | Discharge: 2018-04-25 | Payer: MEDICARE

## 2018-04-25 ENCOUNTER — Ambulatory Visit
Admit: 2018-04-25 | Discharge: 2018-04-25 | Payer: MEDICARE | Attending: Hematology & Oncology | Primary: Hematology & Oncology

## 2018-04-25 ENCOUNTER — Ambulatory Visit: Admit: 2018-04-25 | Discharge: 2018-04-25 | Payer: MEDICARE

## 2018-04-25 DIAGNOSIS — C9111 Chronic lymphocytic leukemia of B-cell type in remission: Principal | ICD-10-CM

## 2018-04-25 DIAGNOSIS — D801 Nonfamilial hypogammaglobulinemia: Secondary | ICD-10-CM

## 2018-04-25 DIAGNOSIS — C9110 Chronic lymphocytic leukemia of B-cell type not having achieved remission: Secondary | ICD-10-CM

## 2018-04-25 DIAGNOSIS — C9112 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: Principal | ICD-10-CM

## 2018-05-23 ENCOUNTER — Ambulatory Visit: Admit: 2018-05-23 | Discharge: 2018-05-23 | Payer: MEDICARE

## 2018-05-23 ENCOUNTER — Other Ambulatory Visit: Admit: 2018-05-23 | Discharge: 2018-05-23 | Payer: MEDICARE

## 2018-05-23 ENCOUNTER — Ambulatory Visit: Admit: 2018-05-23 | Discharge: 2018-05-23 | Payer: MEDICARE | Attending: Adult Health | Primary: Adult Health

## 2018-05-23 DIAGNOSIS — C9112 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: Principal | ICD-10-CM

## 2018-05-23 DIAGNOSIS — C9110 Chronic lymphocytic leukemia of B-cell type not having achieved remission: Secondary | ICD-10-CM

## 2018-05-23 DIAGNOSIS — F0391 Unspecified dementia with behavioral disturbance: Secondary | ICD-10-CM

## 2018-05-23 DIAGNOSIS — D801 Nonfamilial hypogammaglobulinemia: Secondary | ICD-10-CM

## 2018-07-04 ENCOUNTER — Ambulatory Visit
Admit: 2018-07-04 | Discharge: 2018-07-04 | Payer: MEDICARE | Attending: Hematology & Oncology | Primary: Hematology & Oncology

## 2018-07-04 ENCOUNTER — Other Ambulatory Visit: Admit: 2018-07-04 | Discharge: 2018-07-04 | Payer: MEDICARE

## 2018-07-04 DIAGNOSIS — C9112 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: Secondary | ICD-10-CM

## 2018-07-04 DIAGNOSIS — D801 Nonfamilial hypogammaglobulinemia: Principal | ICD-10-CM

## 2018-07-04 DIAGNOSIS — C9110 Chronic lymphocytic leukemia of B-cell type not having achieved remission: Principal | ICD-10-CM

## 2018-08-01 ENCOUNTER — Other Ambulatory Visit: Admit: 2018-08-01 | Discharge: 2018-08-02 | Payer: MEDICARE

## 2018-08-01 ENCOUNTER — Ambulatory Visit: Admit: 2018-08-01 | Discharge: 2018-08-02 | Payer: MEDICARE

## 2018-08-01 DIAGNOSIS — C9112 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: Secondary | ICD-10-CM

## 2018-08-01 DIAGNOSIS — C9110 Chronic lymphocytic leukemia of B-cell type not having achieved remission: Secondary | ICD-10-CM

## 2018-08-01 DIAGNOSIS — D801 Nonfamilial hypogammaglobulinemia: Principal | ICD-10-CM

## 2018-10-03 ENCOUNTER — Telehealth: Admit: 2018-10-03 | Discharge: 2018-10-04 | Payer: MEDICARE

## 2018-10-03 ENCOUNTER — Ambulatory Visit: Admit: 2018-10-03 | Discharge: 2018-10-04 | Payer: MEDICARE

## 2018-10-03 ENCOUNTER — Other Ambulatory Visit: Admit: 2018-10-03 | Discharge: 2018-10-04 | Payer: MEDICARE

## 2018-10-03 DIAGNOSIS — D801 Nonfamilial hypogammaglobulinemia: Principal | ICD-10-CM

## 2018-10-03 DIAGNOSIS — C9112 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: Principal | ICD-10-CM

## 2018-10-03 DIAGNOSIS — Z5112 Encounter for antineoplastic immunotherapy: Principal | ICD-10-CM

## 2018-10-03 DIAGNOSIS — C9110 Chronic lymphocytic leukemia of B-cell type not having achieved remission: Principal | ICD-10-CM

## 2018-11-15 ENCOUNTER — Other Ambulatory Visit: Payer: Self-pay

## 2018-11-15 ENCOUNTER — Ambulatory Visit (INDEPENDENT_AMBULATORY_CARE_PROVIDER_SITE_OTHER): Payer: Medicare Other | Admitting: General Surgery

## 2018-11-15 ENCOUNTER — Encounter: Payer: Self-pay | Admitting: General Surgery

## 2018-11-15 VITALS — BP 165/88 | HR 76 | Temp 97.5°F | Ht 68.0 in | Wt 205.0 lb

## 2018-11-15 DIAGNOSIS — K4090 Unilateral inguinal hernia, without obstruction or gangrene, not specified as recurrent: Secondary | ICD-10-CM | POA: Diagnosis not present

## 2018-11-15 DIAGNOSIS — M6208 Separation of muscle (nontraumatic), other site: Secondary | ICD-10-CM | POA: Diagnosis not present

## 2018-11-15 DIAGNOSIS — K429 Umbilical hernia without obstruction or gangrene: Secondary | ICD-10-CM | POA: Diagnosis not present

## 2018-11-15 NOTE — Progress Notes (Signed)
Patient ID: Nathaniel Butler, male   DOB: September 13, 1935, 83 y.o.   MRN: [id number]  Chief Complaint  Patient presents with  . New Patient (Initial Visit)    left inguinal hernia referred by Threasa Alpha NP    HPI Nathaniel Butler is a 83 y.o. male.   He has been referred for evaluation of an inguinal hernia.  He states that he has had a bulge present in his groin for quite some time.  In the past, it was more painful and he wore a truss.  He says that more recently, it has not been bothering him.  He says that he is always able to reduce the bulge when he lies down, and that it is not always present, even when up and active.  He says that he sometimes feels like he can feel gas bubbling through the bulge, after which, he says he has to pass gas.  He denies any burning in the area and does not experience testicular pain.  He has never had symptoms of obstipation nor any nausea/vomiting suggestive of bowel obstruction.   Past Medical History:  Diagnosis Date  . Anemia   . Anxiety   . BPH (benign prostatic hyperplasia)   . CAD (coronary artery disease)   . CLL (chronic lymphocytic leukemia) ([place])   . COPD (chronic obstructive pulmonary disease) ([place])   . Dementia ([place])   . GERD (gastroesophageal reflux disease)   . Hard of hearing   . Hypertension   . Major depressive disorder   . Myocardial infarction ([place]) 2002  . Osteoarthritis     History reviewed. No pertinent surgical history.  Family History  Problem Relation Age of Onset  . Diabetes Mother   . Heart attack Father     Social History Social History   Tobacco Use  . Smoking status: Never Smoker  . Smokeless tobacco: Never Used  Substance Use Topics  . Alcohol use: Never    Frequency: Never  . Drug use: Never   Resides in a group facility.  No Known Allergies  Current Outpatient Medications  Medication Sig Dispense Refill  . acetaminophen (TYLENOL) 650 MG CR tablet Take 650 mg by mouth every 8 (eight) hours as needed.     Marland Kitchen  acyclovir (ZOVIRAX) 400 MG tablet Take 400 mg by mouth 2 (two) times daily.     Marland Kitchen albuterol (VENTOLIN HFA) 108 (90 Base) MCG/ACT inhaler Inhale into the lungs.    Marland Kitchen allopurinol (ZYLOPRIM) 300 MG tablet Take 300 mg by mouth daily.     Marland Kitchen aspirin EC 81 MG tablet Take 81 mg by mouth daily.     Marland Kitchen atorvastatin (LIPITOR) 10 MG tablet Take 10 mg by mouth daily at 6 PM.     . cetirizine (ZYRTEC) 10 MG tablet Take 10 mg by mouth daily.     . Cholecalciferol (VITAMIN D3) 125 MCG (5000 UT) TABS Take by mouth daily.     . clonazePAM (KLONOPIN) 0.5 MG tablet Take 0.5 mg by mouth.     . Dextromethorphan HBr 15 MG CAPS Take by mouth.    . docusate sodium (COLACE) 100 MG capsule Take 100 mg by mouth daily.     . folic acid (FOLVITE) 1 MG tablet Take 1 mg by mouth daily.     Marland Kitchen lidocaine-prilocaine (EMLA) cream Apply topically.    . magnesium oxide (MAG-OX) 400 MG tablet Take by mouth.    . meloxicam (MOBIC) 7.5 MG tablet Take 7.5 mg by mouth daily.    Marland Kitchen  nitroGLYCERIN (NITROSTAT) 0.4 MG SL tablet Place 0.4 mg under the tongue every 5 (five) minutes as needed for chest pain.    Marland Kitchen ondansetron (ZOFRAN) 4 MG tablet Take by mouth every 8 (eight) hours as needed.     . pantoprazole (PROTONIX) 40 MG tablet Take 40 mg by mouth daily.     . tamsulosin (FLOMAX) 0.4 MG CAPS capsule Take 0.4 mg by mouth daily after supper.     . umeclidinium-vilanterol (ANORO ELLIPTA) 62.5-25 MCG/INH AEPB Inhale into the lungs.    . venlafaxine XR (EFFEXOR-XR) 150 MG 24 hr capsule Take 150 mg by mouth daily with breakfast.      No current facility-administered medications for this visit.     Review of Systems Review of Systems  Respiratory: Positive for shortness of breath.   Cardiovascular: Positive for chest pain and palpitations.  All other systems reviewed and are negative.   Vitals:   11/15/18 1231  BP: (!) 165/88  Pulse: 76  Temp: (!) 97.5 F (36.4 C)     Physical Exam Physical Exam Vitals signs reviewed. Exam  conducted with a chaperone present.  Constitutional:      General: He is not in acute distress.    Appearance: Normal appearance. He is normal weight.  HENT:     Head: Normocephalic and atraumatic.     Nose:     Comments: Wearing a mask secondary to COVID-19 precautions.    Mouth/Throat:     Comments: Wearing a mask secondary to COVID-19 precautions Eyes:     General: No scleral icterus.       Right eye: No discharge.        Left eye: No discharge.  Neck:     Musculoskeletal: Normal range of motion.  Cardiovascular:     Rate and Rhythm: Normal rate and regular rhythm.     Heart sounds: Murmur present.     Comments: 2 out of 6 systolic ejection murmur Pulmonary:     Effort: Pulmonary effort is normal.     Breath sounds: Normal breath sounds.  Abdominal:     General: Abdomen is flat.     Palpations: Abdomen is soft.     Hernia: A hernia is present.     Comments: Diastases rectus is present.  He also has a small umbilical hernia that is easily reducible.  There is a left inguinal hernia that likely contains a portion of his colon based upon palpation.  It reduces without difficulty.  No right inguinal hernia identified.  Genitourinary:    Comments: Deferred Musculoskeletal:        General: No swelling or deformity.  Lymphadenopathy:     Cervical: No cervical adenopathy.  Skin:    General: Skin is warm and dry.  Neurological:     Mental Status: He is alert. Mental status is at baseline.     Comments: Slight pill-rolling tremor  Psychiatric:        Mood and Affect: Mood normal.        Behavior: Behavior normal.     Data Reviewed Clinic notes from his primary care provider were reviewed.  They are largely irrelevant to today's visit.  He also had a visit with cardiology earlier this week.  He had a nuclear medicine stress test that was negative for inducible ischemia.  Assessment This is an 83 year old man with a left inguinal hernia.  He has not experienced any symptoms of  incarceration or strangulation.  He states that it does not  bother him and that he would prefer not to undergo surgery.  Plan We discussed the warning signs of incarceration and strangulation.  He was also provided with a handout in his after visit summary detailing these.  He is aware, as is the aide from his living facility, that should he develop pain, symptoms of obstruction, or the bulge is unable to be reduced, he should present to the emergency department.  If he desires surgery in the future, he is welcome to contact our office and we will make appropriate arrangements.  For now, we will see him on an as-needed basis.    Fredirick Maudlin 11/15/2018, 12:04 PM

## 2018-11-15 NOTE — Patient Instructions (Addendum)
The patient is aware to call back for any questions or new concerns. Call if decides to have surgery.  Inguinal Hernia, Adult An inguinal hernia is when fat or your intestines push through a weak spot in a muscle where your leg meets your lower belly (groin). This causes a rounded lump (bulge). This kind of hernia could also be:  In your scrotum, if you are male.  In folds of skin around your vagina, if you are male. There are three types of inguinal hernias. These include:  Hernias that can be pushed back into the belly (are reducible). This type rarely causes pain.  Hernias that cannot be pushed back into the belly (are incarcerated).  Hernias that cannot be pushed back into the belly and lose their blood supply (are strangulated). This type needs emergency surgery. If you do not have symptoms, you may not need treatment. If you have symptoms or a large hernia, you may need surgery. Follow these instructions at home: Lifestyle  Do these things if told by your doctor so you do not have trouble pooping (constipation): ? Drink enough fluid to keep your pee (urine) pale yellow. ? Eat foods that have a lot of fiber. These include fresh fruits and vegetables, whole grains, and beans. ? Limit foods that are high in fat and processed sugars. These include foods that are fried or sweet. ? Take medicine for trouble pooping.  Avoid lifting heavy objects.  Avoid standing for long amounts of time.  Do not use any products that contain nicotine or tobacco. These include cigarettes and e-cigarettes. If you need help quitting, ask your doctor.  Stay at a healthy weight. General instructions  You may try to push your hernia in by very gently pressing on it when you are lying down. Do not try to force the bulge back in if it will not push in easily.  Watch your hernia for any changes in shape, size, or color. Tell your doctor if you see any changes.  Take over-the-counter and prescription  medicines only as told by your doctor.  Keep all follow-up visits as told by your doctor. This is important. Contact a doctor if:  You have a fever.  You have new symptoms.  Your symptoms get worse. Get help right away if:  The area where your leg meets your lower belly has: ? Pain that gets worse suddenly. ? A bulge that gets bigger suddenly, and it does not get smaller after that. ? A bulge that turns red or purple. ? A bulge that is painful when you touch it.  You are a man, and your scrotum: ? Suddenly feels painful. ? Suddenly changes in size.  You cannot push the hernia in by very gently pressing on it when you are lying down. Do not try to force the bulge back in if it will not push in easily.  You feel sick to your stomach (nauseous), and that feeling does not go away.  You throw up (vomit), and that keeps happening.  You have a fast heartbeat.  You cannot poop (have a bowel movement) or pass gas. These symptoms may be an emergency. Do not wait to see if the symptoms will go away. Get medical help right away. Call your local emergency services (911 in the [place] Summary  An inguinal hernia is when fat or your intestines push through a weak spot in a muscle where your leg meets your lower belly (groin). This causes a rounded lump (bulge).  If you do not have symptoms, you may not need treatment. If you have symptoms or a large hernia, you may need surgery.  Avoid lifting heavy objects. Also avoid standing for long amounts of time.  Do not try to force the bulge back in if it will not push in easily. This information is not intended to replace advice given to you by your health care provider. Make sure you discuss any questions you have with your health care provider. Document Released: 07/22/2006 Document Revised: 07/23/2017 Document Reviewed: 03/23/2017 Elsevier Interactive Patient Education  2019 [employer].

## 2018-11-22 ENCOUNTER — Ambulatory Visit: Admit: 2018-11-22 | Discharge: 2018-11-23 | Payer: MEDICARE

## 2018-11-22 ENCOUNTER — Other Ambulatory Visit: Admit: 2018-11-22 | Discharge: 2018-11-23 | Payer: MEDICARE

## 2018-11-22 DIAGNOSIS — D801 Nonfamilial hypogammaglobulinemia: Principal | ICD-10-CM

## 2018-11-22 DIAGNOSIS — C9110 Chronic lymphocytic leukemia of B-cell type not having achieved remission: Secondary | ICD-10-CM

## 2018-11-22 DIAGNOSIS — C9112 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: Secondary | ICD-10-CM

## 2018-11-23 MED ORDER — MEMANTINE 5 MG TABLET
0 days
Start: 2018-11-23 — End: ?

## 2018-11-24 MED ORDER — ICOSAPENT ETHYL 1 GRAM CAPSULE
0 days
Start: 2018-11-24 — End: ?

## 2018-12-30 ENCOUNTER — Other Ambulatory Visit: Payer: Self-pay | Admitting: *Deleted

## 2018-12-30 DIAGNOSIS — Z20822 Contact with and (suspected) exposure to covid-19: Secondary | ICD-10-CM

## 2019-01-01 ENCOUNTER — Other Ambulatory Visit: Payer: Self-pay

## 2019-01-01 ENCOUNTER — Encounter: Payer: Self-pay | Admitting: General Surgery

## 2019-01-01 ENCOUNTER — Ambulatory Visit (INDEPENDENT_AMBULATORY_CARE_PROVIDER_SITE_OTHER): Payer: Medicare Other | Admitting: General Surgery

## 2019-01-01 VITALS — BP 142/73 | HR 75 | Temp 97.5°F | Resp 16 | Ht 67.0 in | Wt 204.0 lb

## 2019-01-01 DIAGNOSIS — K8020 Calculus of gallbladder without cholecystitis without obstruction: Secondary | ICD-10-CM | POA: Diagnosis not present

## 2019-01-01 NOTE — Progress Notes (Signed)
Patient ID: [name], male   DOB: 04-28-1936, 83 y.o.   MRN: [id number]  Chief Complaint  Patient presents with  . New Patient (Initial Visit)    Gallstones    HPI Nathaniel Butler is a 83 y.o. male.   I recently saw him in clinic for an inguinal hernia.  At that time, there was no concern for incarceration or strangulation and Nathaniel Butler preferred conservative management as opposed to surgery.  He has been referred back to my office for evaluation of cholelithiasis.  Nathaniel Butler states that he sometimes becomes bloated with eating, but denies any abdominal pain.  He occasionally has reflux or water brash.  He is not sure why he had a right upper quadrant ultrasound performed, but cholelithiasis was appreciated.  He denies nausea or vomiting.  No diarrhea.  He has never had jaundice.  He states that he has some lower pelvic discomfort, but attributes that to his hernia.   Past Medical History:  Diagnosis Date  . Anemia   . Anxiety   . BPH (benign prostatic hyperplasia)   . CAD (coronary artery disease)   . CLL (chronic lymphocytic leukemia) ([place])   . COPD (chronic obstructive pulmonary disease) ([place])   . Dementia ([place])   . GERD (gastroesophageal reflux disease)   . Hard of hearing   . Hypertension   . Major depressive disorder   . Myocardial infarction ([place]) 2002  . Osteoarthritis     Past Surgical History:  Procedure Laterality Date  . SKIN CANCER EXCISION      Family History  Problem Relation Age of Onset  . Diabetes Mother   . Heart disease Mother   . Heart attack Father     Social History Social History   Tobacco Use  . Smoking status: Never Smoker  . Smokeless tobacco: Never Used  Substance Use Topics  . Alcohol use: Never    Frequency: Never  . Drug use: Never    No Known Allergies  Current Outpatient Medications  Medication Sig Dispense Refill  . acetaminophen (TYLENOL) 650 MG CR tablet Take 650 mg by mouth every 8 (eight) hours as needed.     Marland Kitchen acyclovir  (ZOVIRAX) 400 MG tablet Take 400 mg by mouth 2 (two) times daily.     Marland Kitchen albuterol (VENTOLIN HFA) 108 (90 Base) MCG/ACT inhaler Inhale into the lungs.    Marland Kitchen allopurinol (ZYLOPRIM) 300 MG tablet Take 300 mg by mouth daily.     Marland Kitchen aspirin EC 81 MG tablet Take 81 mg by mouth daily.     Marland Kitchen atorvastatin (LIPITOR) 10 MG tablet Take 10 mg by mouth daily at 6 PM.     . cetirizine (ZYRTEC) 10 MG tablet Take 10 mg by mouth daily.     . clonazePAM (KLONOPIN) 0.5 MG tablet Take 0.5 mg by mouth.     . Dextromethorphan HBr 15 MG CAPS Take by mouth.    . docusate sodium (COLACE) 100 MG capsule Take 100 mg by mouth daily.     . folic acid (FOLVITE) 1 MG tablet Take 1 mg by mouth daily.     Marland Kitchen lidocaine-prilocaine (EMLA) cream Apply topically.    . magnesium oxide (MAG-OX) 400 MG tablet Take by mouth.    . ondansetron (ZOFRAN) 4 MG tablet Take by mouth every 8 (eight) hours as needed.     . pantoprazole (PROTONIX) 40 MG tablet Take 40 mg by mouth daily.     . tamsulosin (FLOMAX) 0.4 MG CAPS  capsule Take 0.4 mg by mouth daily after supper.     . umeclidinium-vilanterol (ANORO ELLIPTA) 62.5-25 MCG/INH AEPB Inhale into the lungs.    . venlafaxine XR (EFFEXOR-XR) 150 MG 24 hr capsule Take 150 mg by mouth daily with breakfast.     . Cholecalciferol (VITAMIN D3) 125 MCG (5000 UT) TABS Take by mouth daily.     . meloxicam (MOBIC) 7.5 MG tablet Take 7.5 mg by mouth daily.    . nitroGLYCERIN (NITROSTAT) 0.4 MG SL tablet Place 0.4 mg under the tongue every 5 (five) minutes as needed for chest pain.     No current facility-administered medications for this visit.     Review of Systems Review of Systems  Gastrointestinal: Positive for abdominal distention. Negative for abdominal pain, diarrhea, nausea and vomiting.  All other systems reviewed and are negative.  The patient is a poor historian, so I am not sure if this review of systems is completely accurate.  Blood pressure (!) 142/73, pulse 75, temperature (!) 97.5  F (36.4 C), temperature source Temporal, resp. rate 16, height 5\' 7"  (1.702 m), weight 204 lb (92.5 kg), SpO2 96 %.  Physical Exam Physical Exam Vitals signs reviewed.  Constitutional:      General: He is not in acute distress.    Appearance: Normal appearance.  HENT:     Head: Normocephalic and atraumatic.     Nose:     Comments: Covered with a mask secondary to COVID-19 precautions    Mouth/Throat:     Comments: Covered with a mask secondary to COVID-19 precautions Eyes:     General: No scleral icterus. Neck:     Musculoskeletal: Normal range of motion.  Cardiovascular:     Rate and Rhythm: Normal rate.  Pulmonary:     Effort: Pulmonary effort is normal.  Abdominal:     General: There is no distension.     Palpations: Abdomen is soft.     Tenderness: There is no abdominal tenderness.     Comments: Protuberant consistent with his level of obesity  Genitourinary:    Comments: Deferred Musculoskeletal:        General: No swelling or deformity.  Skin:    General: Skin is warm and dry.     Coloration: Skin is not jaundiced.  Neurological:     Mental Status: He is alert. Mental status is at baseline.  Psychiatric:        Mood and Affect: Mood normal.     Data Reviewed I reviewed laboratory studies obtained on 12 December 2018.  These show normal liver function without concern for biliary obstruction.  A complete blood count revealed a normal white cell count.  The report of the ultrasound obtained the same day is available, though not the images.  Specifically, cholelithiasis was appreciated without gallbladder wall thickening or biliary dilatation.  Assessment Right right is an 83 year old man who has gallstones, he does not describe any symptoms that would be consistent with symptomatic cholelithiasis.  He also has a hernia, for which he has been seen here in the past.  Plan Due to the lack of symptoms, I do not feel cholecystectomy is warranted.  It may be, that the  patient's ability to give an accurate history is somewhat limited, however on exam today he is not tender.  He continues to prefer observation for his hernia.  We again provided warning symptoms as part of his printed after visit summary materials for which he should seek attention.  We  will see him on an as-needed basis.   Fredirick Maudlin 01/01/2019, 11:35 AM

## 2019-01-01 NOTE — Patient Instructions (Addendum)
Please call our office if you have questions or concerns.     Cholelithiasis  Cholelithiasis is also called "gallstones." It is a kind of gallbladder disease. The gallbladder is an organ that stores a liquid (bile) that helps you digest fat. Gallstones may not cause symptoms (may be silent gallstones) until they cause a blockage, and then they can cause pain (gallbladder attack). Follow these instructions at home:  Take over-the-counter and prescription medicines only as told by your doctor.  Stay at a healthy weight.  Eat healthy foods. This includes: ? Eating fewer fatty foods, like fried foods. ? Eating fewer refined carbs (refined carbohydrates). Refined carbs are breads and grains that are highly processed, like white bread and white rice. Instead, choose whole grains like whole-wheat bread and brown rice. ? Eating more fiber. Almonds, fresh fruit, and beans are healthy sources of fiber.  Keep all follow-up visits as told by your doctor. This is important. Contact a doctor if:  You have sudden pain in the upper right side of your belly (abdomen). Pain might spread to your right shoulder or your chest. This may be a sign of a gallbladder attack.  You feel sick to your stomach (are nauseous).  You throw up (vomit).  You have been diagnosed with gallstones that have no symptoms and you get: ? Belly pain. ? Discomfort, burning, or fullness in the upper part of your belly (indigestion). Get help right away if:  You have sudden pain in the upper right side of your belly, and it lasts for more than 2 hours.  You have belly pain that lasts for more than 5 hours.  You have a fever or chills.  You keep feeling sick to your stomach or you keep throwing up.  Your skin or the whites of your eyes turn yellow (jaundice).  You have dark-colored pee (urine).  You have light-colored poop (stool). Summary  Cholelithiasis is also called "gallstones."  The gallbladder is an organ  that stores a liquid (bile) that helps you digest fat.  Silent gallstones are gallstones that do not cause symptoms.  A gallbladder attack may cause sudden pain in the upper right side of your belly. Pain might spread to your right shoulder or your chest. If this happens, contact your doctor.  If you have sudden pain in the upper right side of your belly that lasts for more than 2 hours, get help right away. This information is not intended to replace advice given to you by your  Inguinal Hernia, Adult An inguinal hernia is when fat or your intestines push through a weak spot in a muscle where your leg meets your lower belly (groin). This causes a rounded lump (bulge). This kind of hernia could also be:  In your scrotum, if you are male.  In folds of skin around your vagina, if you are male. There are three types of inguinal hernias. These include:  Hernias that can be pushed back into the belly (are reducible). This type rarely causes pain.  Hernias that cannot be pushed back into the belly (are incarcerated).  Hernias that cannot be pushed back into the belly and lose their blood supply (are strangulated). This type needs emergency surgery. If you do not have symptoms, you may not need treatment. If you have symptoms or a large hernia, you may need surgery. Follow these instructions at home: Lifestyle  Do these things if told by your doctor so you do not have trouble pooping (constipation): ? Drink enough  fluid to keep your pee (urine) pale yellow. ? Eat foods that have a lot of fiber. These include fresh fruits and vegetables, whole grains, and beans. ? Limit foods that are high in fat and processed sugars. These include foods that are fried or sweet. ? Take medicine for trouble pooping.  Avoid lifting heavy objects.  Avoid standing for long amounts of time.  Do not use any products that contain nicotine or tobacco. These include cigarettes and e-cigarettes. If you need help  quitting, ask your doctor.  Stay at a healthy weight. General instructions  You may try to push your hernia in by very gently pressing on it when you are lying down. Do not try to force the bulge back in if it will not push in easily.  Watch your hernia for any changes in shape, size, or color. Tell your doctor if you see any changes.  Take over-the-counter and prescription medicines only as told by your doctor.  Keep all follow-up visits as told by your doctor. This is important. Contact a doctor if:  You have a fever.  You have new symptoms.  Your symptoms get worse. Get help right away if:  The area where your leg meets your lower belly has: ? Pain that gets worse suddenly. ? A bulge that gets bigger suddenly, and it does not get smaller after that. ? A bulge that turns red or purple. ? A bulge that is painful when you touch it.  You are a man, and your scrotum: ? Suddenly feels painful. ? Suddenly changes in size.  You cannot push the hernia in by very gently pressing on it when you are lying down. Do not try to force the bulge back in if it will not push in easily.  You feel sick to your stomach (nauseous), and that feeling does not go away.  You throw up (vomit), and that keeps happening.  You have a fast heartbeat.  You cannot poop (have a bowel movement) or pass gas. These symptoms may be an emergency. Do not wait to see if the symptoms will go away. Get medical help right away. Call your local emergency services (911 in the [place] Summary  An inguinal hernia is when fat or your intestines push through a weak spot in a muscle where your leg meets your lower belly (groin). This causes a rounded lump (bulge).  If you do not have symptoms, you may not need treatment. If you have symptoms or a large hernia, you may need surgery.  Avoid lifting heavy objects. Also avoid standing for long amounts of time.  Do not try to force the bulge back in if it will not push in  easily. This information is not intended to replace advice given to you by your health care provider. Make sure you discuss any questions you have with your health care provider. Document Released: 07/22/2006 Document Revised: 07/23/2017 Document Reviewed: 03/23/2017 Elsevier Patient Education  2020 [employer]. health care provider. Make sure you discuss any questions you have with your health care provider. Document Released: 12/08/2007 Document Revised: 06/03/2017 Document Reviewed: 03/07/2016 Elsevier Patient Education  2020 [employer].

## 2019-01-05 LAB — NOVEL CORONAVIRUS, NAA: SARS-CoV-2, NAA: NOT DETECTED

## 2019-01-08 ENCOUNTER — Telehealth: Payer: Self-pay | Admitting: Family Medicine

## 2019-01-08 NOTE — Telephone Encounter (Signed)
Negative COVID results were given. Patient expressed understanding thank you

## 2019-01-24 ENCOUNTER — Ambulatory Visit: Admit: 2019-01-24 | Discharge: 2019-01-25 | Payer: MEDICARE

## 2019-01-24 ENCOUNTER — Institutional Professional Consult (permissible substitution): Admit: 2019-01-24 | Discharge: 2019-01-25 | Payer: MEDICARE | Attending: Adult Health | Primary: Adult Health

## 2019-01-24 DIAGNOSIS — D801 Nonfamilial hypogammaglobulinemia: Secondary | ICD-10-CM

## 2019-01-24 DIAGNOSIS — C9112 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: Principal | ICD-10-CM

## 2019-01-24 DIAGNOSIS — Z85828 Personal history of other malignant neoplasm of skin: Secondary | ICD-10-CM

## 2019-01-24 DIAGNOSIS — F0391 Unspecified dementia with behavioral disturbance: Secondary | ICD-10-CM

## 2019-03-19 ENCOUNTER — Ambulatory Visit
Admit: 2019-03-19 | Discharge: 2019-03-20 | Payer: MEDICARE | Attending: Student in an Organized Health Care Education/Training Program | Primary: Student in an Organized Health Care Education/Training Program

## 2019-03-19 DIAGNOSIS — L821 Other seborrheic keratosis: Secondary | ICD-10-CM

## 2019-03-19 DIAGNOSIS — C4491 Basal cell carcinoma of skin, unspecified: Secondary | ICD-10-CM

## 2019-03-19 DIAGNOSIS — D485 Neoplasm of uncertain behavior of skin: Secondary | ICD-10-CM

## 2019-03-19 DIAGNOSIS — L814 Other melanin hyperpigmentation: Secondary | ICD-10-CM

## 2019-03-19 DIAGNOSIS — Z85828 Personal history of other malignant neoplasm of skin: Secondary | ICD-10-CM

## 2019-03-27 ENCOUNTER — Ambulatory Visit: Admit: 2019-03-27 | Discharge: 2019-03-28 | Payer: MEDICARE | Attending: Adult Health | Primary: Adult Health

## 2019-03-27 ENCOUNTER — Other Ambulatory Visit: Admit: 2019-03-27 | Discharge: 2019-03-28 | Payer: MEDICARE

## 2019-03-27 ENCOUNTER — Ambulatory Visit: Admit: 2019-03-27 | Discharge: 2019-03-28 | Payer: MEDICARE

## 2019-03-27 DIAGNOSIS — I2510 Atherosclerotic heart disease of native coronary artery without angina pectoris: Secondary | ICD-10-CM

## 2019-03-27 DIAGNOSIS — Z9221 Personal history of antineoplastic chemotherapy: Secondary | ICD-10-CM

## 2019-03-27 DIAGNOSIS — E785 Hyperlipidemia, unspecified: Secondary | ICD-10-CM

## 2019-03-27 DIAGNOSIS — I110 Hypertensive heart disease with heart failure: Secondary | ICD-10-CM

## 2019-03-27 DIAGNOSIS — F419 Anxiety disorder, unspecified: Secondary | ICD-10-CM

## 2019-03-27 DIAGNOSIS — I509 Heart failure, unspecified: Secondary | ICD-10-CM

## 2019-03-27 DIAGNOSIS — D801 Nonfamilial hypogammaglobulinemia: Secondary | ICD-10-CM

## 2019-03-27 DIAGNOSIS — Z7982 Long term (current) use of aspirin: Secondary | ICD-10-CM

## 2019-03-27 DIAGNOSIS — Z79899 Other long term (current) drug therapy: Secondary | ICD-10-CM

## 2019-03-27 DIAGNOSIS — Z85828 Personal history of other malignant neoplasm of skin: Secondary | ICD-10-CM

## 2019-03-27 DIAGNOSIS — F0390 Unspecified dementia without behavioral disturbance: Secondary | ICD-10-CM

## 2019-03-27 DIAGNOSIS — Z8673 Personal history of transient ischemic attack (TIA), and cerebral infarction without residual deficits: Secondary | ICD-10-CM

## 2019-03-27 DIAGNOSIS — C9112 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: Secondary | ICD-10-CM

## 2019-03-27 DIAGNOSIS — C9110 Chronic lymphocytic leukemia of B-cell type not having achieved remission: Secondary | ICD-10-CM

## 2019-03-27 DIAGNOSIS — C44310 Basal cell carcinoma of skin of unspecified parts of face: Secondary | ICD-10-CM

## 2019-05-21 ENCOUNTER — Ambulatory Visit: Admit: 2019-05-21 | Discharge: 2019-05-22 | Payer: MEDICARE

## 2019-05-22 DIAGNOSIS — C9110 Chronic lymphocytic leukemia of B-cell type not having achieved remission: Principal | ICD-10-CM

## 2019-05-29 ENCOUNTER — Other Ambulatory Visit: Admit: 2019-05-29 | Discharge: 2019-05-30 | Payer: MEDICARE

## 2019-05-29 ENCOUNTER — Ambulatory Visit
Admit: 2019-05-29 | Discharge: 2019-05-30 | Payer: MEDICARE | Attending: Hematology & Oncology | Primary: Hematology & Oncology

## 2019-05-29 ENCOUNTER — Ambulatory Visit: Admit: 2019-05-29 | Discharge: 2019-05-30 | Payer: MEDICARE

## 2019-05-29 DIAGNOSIS — D801 Nonfamilial hypogammaglobulinemia: Principal | ICD-10-CM

## 2019-05-29 DIAGNOSIS — C9111 Chronic lymphocytic leukemia of B-cell type in remission: Principal | ICD-10-CM

## 2019-05-29 DIAGNOSIS — C9112 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: Principal | ICD-10-CM

## 2019-07-10 ENCOUNTER — Ambulatory Visit: Admit: 2019-07-10 | Discharge: 2019-07-10 | Payer: MEDICARE

## 2019-07-10 ENCOUNTER — Other Ambulatory Visit: Admit: 2019-07-10 | Discharge: 2019-07-10 | Payer: MEDICARE

## 2019-07-10 DIAGNOSIS — D801 Nonfamilial hypogammaglobulinemia: Principal | ICD-10-CM

## 2019-07-10 DIAGNOSIS — C9112 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: Principal | ICD-10-CM

## 2019-07-19 DIAGNOSIS — I10 Essential (primary) hypertension: Principal | ICD-10-CM

## 2019-07-30 ENCOUNTER — Emergency Department
Admission: EM | Admit: 2019-07-30 | Discharge: 2019-07-30 | Disposition: A | Discharge status: Home / Self Care | Payer: Medicare Other | Attending: Emergency Medicine | Admitting: Emergency Medicine

## 2019-07-30 ENCOUNTER — Other Ambulatory Visit: Payer: Self-pay

## 2019-07-30 ENCOUNTER — Emergency Department: Payer: Medicare Other

## 2019-07-30 DIAGNOSIS — F0390 Unspecified dementia without behavioral disturbance: Secondary | ICD-10-CM | POA: Diagnosis not present

## 2019-07-30 DIAGNOSIS — I252 Old myocardial infarction: Secondary | ICD-10-CM | POA: Insufficient documentation

## 2019-07-30 DIAGNOSIS — T50905A Adverse effect of unspecified drugs, medicaments and biological substances, initial encounter: Secondary | ICD-10-CM

## 2019-07-30 DIAGNOSIS — R0602 Shortness of breath: Secondary | ICD-10-CM | POA: Diagnosis present

## 2019-07-30 DIAGNOSIS — I2510 Atherosclerotic heart disease of native coronary artery without angina pectoris: Secondary | ICD-10-CM | POA: Diagnosis not present

## 2019-07-30 DIAGNOSIS — T50Z95A Adverse effect of other vaccines and biological substances, initial encounter: Secondary | ICD-10-CM | POA: Diagnosis not present

## 2019-07-30 DIAGNOSIS — I10 Essential (primary) hypertension: Secondary | ICD-10-CM | POA: Diagnosis not present

## 2019-07-30 DIAGNOSIS — Z20822 Contact with and (suspected) exposure to covid-19: Secondary | ICD-10-CM | POA: Insufficient documentation

## 2019-07-30 DIAGNOSIS — J449 Chronic obstructive pulmonary disease, unspecified: Secondary | ICD-10-CM | POA: Insufficient documentation

## 2019-07-30 DIAGNOSIS — Z79899 Other long term (current) drug therapy: Secondary | ICD-10-CM | POA: Diagnosis not present

## 2019-07-30 LAB — CBC WITH DIFFERENTIAL/PLATELET
Abs Immature Granulocytes: 0.03 10*3/uL (ref 0.00–0.07)
Basophils Absolute: 0.1 10*3/uL (ref 0.0–0.1)
Basophils Relative: 1 %
Eosinophils Absolute: 0.1 10*3/uL (ref 0.0–0.5)
Eosinophils Relative: 1 %
HCT: 39.5 % (ref 39.0–52.0)
Hemoglobin: 13.4 g/dL (ref 13.0–17.0)
Immature Granulocytes: 0 %
Lymphocytes Relative: 22 %
Lymphs Abs: 1.6 10*3/uL (ref 0.7–4.0)
MCH: 31.4 pg (ref 26.0–34.0)
MCHC: 33.9 g/dL (ref 30.0–36.0)
MCV: 92.5 fL (ref 80.0–100.0)
Monocytes Absolute: 0.8 10*3/uL (ref 0.1–1.0)
Monocytes Relative: 11 %
Neutro Abs: 4.9 10*3/uL (ref 1.7–7.7)
Neutrophils Relative %: 65 %
Platelets: 183 10*3/uL (ref 150–400)
RBC: 4.27 MIL/uL (ref 4.22–5.81)
RDW: 13.2 % (ref 11.5–15.5)
WBC: 7.6 10*3/uL (ref 4.0–10.5)
nRBC: 0 % (ref 0.0–0.2)

## 2019-07-30 LAB — URINALYSIS, COMPLETE (UACMP) WITH MICROSCOPIC
Bacteria, UA: NONE SEEN
Bilirubin Urine: NEGATIVE
Glucose, UA: NEGATIVE mg/dL
Hgb urine dipstick: NEGATIVE
Ketones, ur: NEGATIVE mg/dL
Leukocytes,Ua: NEGATIVE
Nitrite: NEGATIVE
Protein, ur: NEGATIVE mg/dL
Specific Gravity, Urine: 1.025 (ref 1.005–1.030)
Squamous Epithelial / HPF: NONE SEEN (ref 0–5)
pH: 5 (ref 5.0–8.0)

## 2019-07-30 LAB — COMPREHENSIVE METABOLIC PANEL
ALT: 56 U/L — ABNORMAL HIGH (ref 0–44)
AST: 46 U/L — ABNORMAL HIGH (ref 15–41)
Albumin: 4.3 g/dL (ref 3.5–5.0)
Alkaline Phosphatase: 76 U/L (ref 38–126)
Anion gap: 11 (ref 5–15)
BUN: 23 mg/dL (ref 8–23)
CO2: 25 mmol/L (ref 22–32)
Calcium: 9.2 mg/dL (ref 8.9–10.3)
Chloride: 107 mmol/L (ref 98–111)
Creatinine, Ser: 1.22 mg/dL (ref 0.61–1.24)
GFR calc Af Amer: >60 mL/min (ref >60)
GFR calc non Af Amer: 55 mL/min — ABNORMAL LOW (ref >60)
Glucose, Bld: 132 mg/dL — ABNORMAL HIGH (ref 70–99)
Potassium: 4.3 mmol/L (ref 3.5–5.1)
Sodium: 143 mmol/L (ref 135–145)
Total Bilirubin: 0.7 mg/dL (ref 0.3–1.2)
Total Protein: 7 g/dL (ref 6.5–8.1)

## 2019-07-30 LAB — TROPONIN I (HIGH SENSITIVITY): Troponin I (High Sensitivity): 7 ng/L (ref <18)

## 2019-07-30 NOTE — ED Triage Notes (Signed)
Pt states he had the 1st injection of the covid vaccine on Friday and that evening started having generalized bodyache/fatigue with diarrhea on Saturday and Sunday , SOB. Pt is in NAD. VSS.

## 2019-07-30 NOTE — Discharge Instructions (Addendum)
Follow-up with your regular doctor as needed.  I feel all of your symptoms are due to to a side effect of the Covid vaccine.  We still did a Covid test which will result in 6 to 24 hours.  Return to the emergency department if you are worsening.

## 2019-07-30 NOTE — ED Provider Notes (Signed)
-----------------------------------------   2:13 PM on 07/30/2019 -----------------------------------------  ED ECG REPORT I, Arta Silence, the attending physician, personally viewed and interpreted this ECG.  Date: 07/30/2019 EKG Time: 1410 Rate: 85 Rhythm: normal sinus rhythm QRS Axis: normal Intervals: Prolonged PR ST/T Wave abnormalities: normal Narrative Interpretation: no evidence of acute ischemia    Arta Silence, MD 07/30/19 1413

## 2019-07-30 NOTE — ED Notes (Signed)
See triage note  Presents with some body aches and diarrhea   States he is also having some fatigue  States the sx's started after having COVID vaccine    Afebrile on arrival

## 2019-07-30 NOTE — ED Notes (Signed)
Pt and Pt's caregiver verbalized understanding of discharge instructions. NAD at this time.

## 2019-07-30 NOTE — ED Provider Notes (Signed)
[facility] Emergency Department Provider Note  ____________________________________________   First MD Initiated Contact with Patient 07/30/19 1257     (approximate)  I have reviewed the triage vital signs and the nursing notes.   HISTORY  Chief Complaint Generalized Body Aches    HPI Nathaniel Butler is a 84 y.o. male presents emergency department complaining of shortness of breath, night sweats, chills, body aches, nausea/vomiting and diarrhea.  Patient states symptoms started after getting his Covid vaccine.  He states he felt fine prior to the vaccine and all of this started the night of the vaccine.  He tested negative for Covid at the assisted living facility.  She states that he test twice weekly.  He denies any other symptoms at this time.    Past Medical History:  Diagnosis Date  . Anemia   . Anxiety   . BPH (benign prostatic hyperplasia)   . CAD (coronary artery disease)   . CLL (chronic lymphocytic leukemia) ([place])   . COPD (chronic obstructive pulmonary disease) ([place])   . Dementia ([place])   . GERD (gastroesophageal reflux disease)   . Hard of hearing   . Hypertension   . Major depressive disorder   . Myocardial infarction ([place]) 2002  . Osteoarthritis     Patient Active Problem List   Diagnosis Date Noted  . Calculus of gallbladder without cholecystitis without obstruction 01/01/2019  . Non-recurrent unilateral inguinal hernia without obstruction or gangrene 11/15/2018  . Umbilical hernia without obstruction and without gangrene 11/15/2018  . Diastasis of rectus abdominis 11/15/2018  . Hypogammaglobulinemia ([place]) 11/23/2017  . Dementia ([place]) 11/08/2017  . Diarrhea due to drug 10/23/2017  . Hyperchloremia 10/23/2017  . Hypokalemia 10/23/2017  . Leukocytosis 10/23/2017  . 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 Metabolic acidosis with normal anion gap and bicarbonate losses 10/23/2017  . Severe protein-calorie malnutrition ([place]) 10/23/2017  .  CLL (chronic lymphocytic leukemia) ([place]) 08/23/2017    Past Surgical History:  Procedure Laterality Date  . SKIN CANCER EXCISION      Prior to Admission medications   Medication Sig Start Date End Date Taking? Authorizing Provider  acetaminophen (TYLENOL) 650 MG CR tablet Take 650 mg by mouth every 8 (eight) hours as needed.     [provider]  acyclovir (ZOVIRAX) 400 MG tablet Take 400 mg by mouth 2 (two) times daily.  06/05/17   [provider]  albuterol (VENTOLIN HFA) 108 (90 Base) MCG/ACT inhaler Inhale into the lungs.    [provider]  allopurinol (ZYLOPRIM) 300 MG tablet Take 300 mg by mouth daily.  11/23/17   [provider]  aspirin EC 81 MG tablet Take 81 mg by mouth daily.     [provider]  atorvastatin (LIPITOR) 10 MG tablet Take 10 mg by mouth daily at 6 PM.     [provider]  cetirizine (ZYRTEC) 10 MG tablet Take 10 mg by mouth daily.     [provider]  Cholecalciferol (VITAMIN D3) 125 MCG (5000 UT) TABS Take by mouth daily.     [provider]  clonazePAM (KLONOPIN) 0.5 MG tablet Take 0.5 mg by mouth.     [provider]  Dextromethorphan HBr 15 MG CAPS Take by mouth.    [provider]  docusate sodium (COLACE) 100 MG capsule Take 100 mg by mouth daily.     [provider]  folic acid (FOLVITE) 1 MG tablet Take 1 mg by mouth daily.  [provider]  lidocaine-prilocaine (EMLA) cream Apply topically.    [provider]  magnesium oxide (MAG-OX) 400 MG tablet Take by mouth.    [provider]  meloxicam (MOBIC) 7.5 MG tablet Take 7.5 mg by mouth daily.    [provider]  nitroGLYCERIN (NITROSTAT) 0.4 MG SL tablet Place 0.4 mg under the tongue every 5 (five) minutes as needed for chest pain.    [provider]  ondansetron (ZOFRAN) 4 MG tablet Take by mouth every 8 (eight) hours as needed.     [provider]   pantoprazole (PROTONIX) 40 MG tablet Take 40 mg by mouth daily.     [provider]  tamsulosin (FLOMAX) 0.4 MG CAPS capsule Take 0.4 mg by mouth daily after supper.     [provider]  umeclidinium-vilanterol (ANORO ELLIPTA) 62.5-25 MCG/INH AEPB Inhale into the lungs.    [provider]  venlafaxine XR (EFFEXOR-XR) 150 MG 24 hr capsule Take 150 mg by mouth daily with breakfast.     [provider]    Allergies Patient has no known allergies.  Family History  Problem Relation Age of Onset  . Diabetes Mother   . Heart disease Mother   . Heart attack Father     Social History Social History   Tobacco Use  . Smoking status: Never Smoker  . Smokeless tobacco: Never Used  Substance Use Topics  . Alcohol use: Never  . Drug use: Never    Review of Systems  Constitutional: No fever/positive chills, positive for night sweats Eyes: No visual changes. ENT: No sore throat. Respiratory: Positive cough and shortness of breath Cardiovascular: Denies chest pain Gastrointestinal: Denies abdominal pain positive for nausea/vomiting/diarrhea Genitourinary: Negative for dysuria. Musculoskeletal: Negative for back pain. Skin: Negative for rash. Psychiatric: no mood changes,     ____________________________________________   PHYSICAL EXAM:  VITAL SIGNS: ED Triage Vitals  Enc Vitals Group     BP 07/30/19 1223 (!) 144/71     Pulse Rate 07/30/19 1223 61     Resp 07/30/19 1223 18     Temp 07/30/19 1223 98.6 F (37 C)     Temp Source 07/30/19 1223 Oral     SpO2 07/30/19 1223 98 %     Weight 07/30/19 1224 210 lb (95.3 kg)     Height 07/30/19 1224 5\' 8"  (1.727 m)     Head Circumference --      Peak Flow --      Pain Score 07/30/19 1224 9     Pain Loc --      Pain Edu? --      Excl. in [place]? --     Constitutional: Alert and oriented. Well appearing and in no acute distress. Eyes: Conjunctivae are normal.  Head: Atraumatic. Nose: No  congestion/rhinnorhea. Mouth/Throat: Mucous membranes are moist.   Neck:  supple no lymphadenopathy noted Cardiovascular: Normal rate, regular rhythm. Heart sounds are normal Respiratory: Normal respiratory effort.  No retractions, lungs c t a  Abd: soft nontender bs normal all 4 quad GU: deferred Musculoskeletal: FROM all extremities, warm and well perfused Neurologic:  Normal speech and language.  Skin:  Skin is warm, dry and intact. No rash noted. Psychiatric: Mood and affect are normal. Speech and behavior are normal.  ____________________________________________   LABS (all labs ordered are listed, but only abnormal results are displayed)  Labs Reviewed  COMPREHENSIVE METABOLIC PANEL - Abnormal; Notable for the following components:      Result  Value   Glucose, Bld 132 (*)    AST 46 (*)    ALT 56 (*)    GFR calc non Af Amer 55 (*)    All other components within normal limits  URINALYSIS, COMPLETE (UACMP) WITH MICROSCOPIC - Abnormal; Notable for the following components:   Color, Urine YELLOW (*)    APPearance HAZY (*)    All other components within normal limits  SARS CORONAVIRUS 2 (TAT 6-24 HRS)  CBC WITH DIFFERENTIAL/PLATELET  TROPONIN I (HIGH SENSITIVITY)   ____________________________________________   ____________________________________________  RADIOLOGY  Chest x-ray is normal  ____________________________________________   PROCEDURES  Procedure(s) performed: EKG shows normal sinus rhythm   Procedures    ____________________________________________   INITIAL IMPRESSION / ASSESSMENT AND PLAN / ED COURSE  Pertinent labs & imaging results that were available during my care of the patient were reviewed by me and considered in my medical decision making (see chart for details).   Patient is 84 year old male presents emergency department after having a Covid vaccine on Friday.  States he started having Covid symptoms that night.  Physical exam  patient's vitals are normal.  He appears well.  No distress.  Exam is otherwise unremarkable  DDx: Vaccine side effect, Covid, pneumonia, UTI  Chest x-ray is normal, CBC is normal, EKG shows normal sinus rhythm, metabolic panel is basically normal, troponin is normal, Covid test pending  Explained the findings to the patient and his caregiver.  We did do a Covid test although I do feel like his symptoms are all related as a side effect to the Covid vaccine.  He is to follow-up with his regular doctor if not better in 3 days.  Return emergency department worsening.  He was discharged in stable condition.  He is to follow-up with his doctor at [facility] to see if they want to perform a repeat chest x-ray.    Nathaniel Butler was evaluated in Emergency Department on 07/30/2019 for the symptoms described in the history of present illness. He was evaluated in the context of the global COVID-19 pandemic, which necessitated consideration that the patient might be at risk for infection with the SARS-CoV-2 virus that causes COVID-19. Institutional protocols and algorithms that pertain to the evaluation of patients at risk for COVID-19 are in a state of rapid change based on information released by regulatory bodies including the CDC and federal and state organizations. These policies and algorithms were followed during the patient's care in the ED.   As part of my medical decision making, I reviewed the following data within the [place] notes reviewed and incorporated, Labs reviewed see above, EKG interpreted NSR, Old chart reviewed, Radiograph reviewed chest x-ray normal, Notes from prior ED visits and [place] Controlled Substance Database  ____________________________________________   FINAL CLINICAL IMPRESSION(S) / ED DIAGNOSES  Final diagnoses:  Suspected COVID-19 virus infection  Medication side effect, initial encounter      NEW MEDICATIONS STARTED DURING THIS VISIT:  Discharge  Medication List as of 07/30/2019  2:53 PM       Note:  This document was prepared using Dragon voice recognition software and may include unintentional dictation errors.    Versie Starks, PA-C 07/30/19 1614    Duffy Bruce, MD 07/31/19 [phone]

## 2019-07-31 LAB — SARS CORONAVIRUS 2 (TAT 6-24 HRS): SARS Coronavirus 2: NEGATIVE

## 2019-08-20 ENCOUNTER — Ambulatory Visit: Admit: 2019-08-20 | Discharge: 2019-08-21 | Payer: MEDICARE

## 2019-08-21 ENCOUNTER — Ambulatory Visit: Admit: 2019-08-21 | Discharge: 2019-08-22 | Payer: MEDICARE

## 2019-08-21 DIAGNOSIS — N400 Enlarged prostate without lower urinary tract symptoms: Principal | ICD-10-CM

## 2019-08-21 DIAGNOSIS — I2510 Atherosclerotic heart disease of native coronary artery without angina pectoris: Principal | ICD-10-CM

## 2019-08-21 DIAGNOSIS — N183 Chronic kidney disease, stage 3 (moderate): Principal | ICD-10-CM

## 2019-08-21 DIAGNOSIS — I10 Essential (primary) hypertension: Principal | ICD-10-CM

## 2019-08-28 ENCOUNTER — Ambulatory Visit: Admit: 2019-08-28 | Discharge: 2019-08-28 | Payer: MEDICARE

## 2019-08-28 ENCOUNTER — Other Ambulatory Visit: Admit: 2019-08-28 | Discharge: 2019-08-28 | Payer: MEDICARE

## 2019-08-28 ENCOUNTER — Ambulatory Visit: Admit: 2019-08-28 | Discharge: 2019-08-28 | Payer: MEDICARE | Attending: Adult Health | Primary: Adult Health

## 2019-08-28 DIAGNOSIS — C9112 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: Principal | ICD-10-CM

## 2019-08-28 DIAGNOSIS — F0391 Unspecified dementia with behavioral disturbance: Principal | ICD-10-CM

## 2019-08-28 DIAGNOSIS — D801 Nonfamilial hypogammaglobulinemia: Principal | ICD-10-CM

## 2019-08-28 DIAGNOSIS — C9110 Chronic lymphocytic leukemia of B-cell type not having achieved remission: Principal | ICD-10-CM

## 2019-09-21 DIAGNOSIS — D801 Nonfamilial hypogammaglobulinemia: Principal | ICD-10-CM

## 2019-09-21 DIAGNOSIS — C9110 Chronic lymphocytic leukemia of B-cell type not having achieved remission: Principal | ICD-10-CM

## 2019-10-24 ENCOUNTER — Ambulatory Visit: Admit: 2019-10-24 | Discharge: 2019-10-25 | Payer: MEDICARE

## 2019-10-24 DIAGNOSIS — C9112 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: Principal | ICD-10-CM

## 2019-10-24 DIAGNOSIS — D801 Nonfamilial hypogammaglobulinemia: Principal | ICD-10-CM

## 2019-11-12 ENCOUNTER — Ambulatory Visit: Admit: 2019-11-12 | Discharge: 2019-11-13 | Payer: MEDICARE

## 2019-11-12 MED ORDER — HYDROCODONE 5 MG-ACETAMINOPHEN 325 MG TABLET: 1 | tablet | 0 refills | 2 days | Status: AC

## 2019-11-12 MED ORDER — HYDROCODONE 5 MG-ACETAMINOPHEN 325 MG TABLET
ORAL_TABLET | ORAL | 0 refills | 0.00000 days | Status: CP | PRN
Start: 2019-11-12 — End: ?

## 2019-11-19 ENCOUNTER — Ambulatory Visit: Admit: 2019-11-19 | Discharge: 2019-11-20 | Payer: MEDICARE

## 2019-11-20 ENCOUNTER — Other Ambulatory Visit: Payer: Self-pay | Admitting: Physical Medicine and Rehabilitation

## 2019-11-20 DIAGNOSIS — M5416 Radiculopathy, lumbar region: Secondary | ICD-10-CM

## 2019-11-23 DIAGNOSIS — C4492 Squamous cell carcinoma of skin, unspecified: Principal | ICD-10-CM

## 2019-12-04 ENCOUNTER — Other Ambulatory Visit: Payer: Self-pay

## 2019-12-04 ENCOUNTER — Ambulatory Visit
Admission: RE | Admit: 2019-12-04 | Discharge: 2019-12-04 | Disposition: A | Discharge status: Home / Self Care | Payer: Medicare Other | Source: Ambulatory Visit | Attending: Physical Medicine and Rehabilitation | Admitting: Physical Medicine and Rehabilitation

## 2019-12-04 DIAGNOSIS — M5416 Radiculopathy, lumbar region: Secondary | ICD-10-CM

## 2020-01-16 MED ORDER — GABAPENTIN 300 MG CAPSULE
0 days
Start: 2020-01-16 — End: ?

## 2020-02-18 ENCOUNTER — Ambulatory Visit: Admit: 2020-02-18 | Discharge: 2020-02-19 | Payer: MEDICARE

## 2020-03-31 DIAGNOSIS — N1831 Stage 3a chronic kidney disease: Principal | ICD-10-CM

## 2020-03-31 DIAGNOSIS — I10 Essential (primary) hypertension: Principal | ICD-10-CM

## 2020-04-02 ENCOUNTER — Other Ambulatory Visit: Admit: 2020-04-02 | Discharge: 2020-04-03 | Payer: MEDICARE

## 2020-04-02 ENCOUNTER — Ambulatory Visit: Admit: 2020-04-02 | Discharge: 2020-04-03 | Payer: MEDICARE | Attending: Adult Health | Primary: Adult Health

## 2020-04-02 DIAGNOSIS — I10 Essential (primary) hypertension: Principal | ICD-10-CM

## 2020-04-02 DIAGNOSIS — C9112 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: Principal | ICD-10-CM

## 2020-04-02 DIAGNOSIS — C9110 Chronic lymphocytic leukemia of B-cell type not having achieved remission: Principal | ICD-10-CM

## 2020-04-02 DIAGNOSIS — D801 Nonfamilial hypogammaglobulinemia: Principal | ICD-10-CM

## 2020-04-02 DIAGNOSIS — N1831 Stage 3a chronic kidney disease: Principal | ICD-10-CM

## 2020-04-05 DIAGNOSIS — D801 Nonfamilial hypogammaglobulinemia: Principal | ICD-10-CM

## 2020-04-07 ENCOUNTER — Ambulatory Visit: Admit: 2020-04-07 | Discharge: 2020-04-08 | Payer: MEDICARE

## 2020-04-07 DIAGNOSIS — I10 Essential (primary) hypertension: Principal | ICD-10-CM

## 2020-04-07 DIAGNOSIS — N400 Enlarged prostate without lower urinary tract symptoms: Principal | ICD-10-CM

## 2020-04-07 DIAGNOSIS — N1831 Stage 3a chronic kidney disease (CMS-HCC): Principal | ICD-10-CM

## 2020-04-08 ENCOUNTER — Ambulatory Visit: Admit: 2020-04-08 | Discharge: 2020-04-09 | Payer: MEDICARE

## 2020-04-08 DIAGNOSIS — D801 Nonfamilial hypogammaglobulinemia: Principal | ICD-10-CM

## 2020-05-05 DIAGNOSIS — D801 Nonfamilial hypogammaglobulinemia: Principal | ICD-10-CM

## 2020-06-03 ENCOUNTER — Ambulatory Visit: Admit: 2020-06-03 | Discharge: 2020-06-04 | Payer: MEDICARE

## 2020-06-03 DIAGNOSIS — D801 Nonfamilial hypogammaglobulinemia: Principal | ICD-10-CM

## 2020-06-05 MED ORDER — CLONAZEPAM 0.5 MG TABLET
ORAL | 0 days
Start: 2020-06-05 — End: ?

## 2020-07-23 ENCOUNTER — Ambulatory Visit: Admit: 2020-07-23 | Discharge: 2020-07-23 | Payer: MEDICARE | Attending: Adult Health | Primary: Adult Health

## 2020-07-23 ENCOUNTER — Ambulatory Visit: Admit: 2020-07-23 | Discharge: 2020-07-23 | Payer: MEDICARE

## 2020-07-23 DIAGNOSIS — C9110 Chronic lymphocytic leukemia of B-cell type not having achieved remission: Principal | ICD-10-CM

## 2020-07-23 DIAGNOSIS — D801 Nonfamilial hypogammaglobulinemia: Principal | ICD-10-CM

## 2020-07-25 DIAGNOSIS — D801 Nonfamilial hypogammaglobulinemia: Principal | ICD-10-CM

## 2020-07-31 ENCOUNTER — Ambulatory Visit: Admit: 2020-07-31 | Discharge: 2020-08-01 | Payer: MEDICARE

## 2020-07-31 ENCOUNTER — Other Ambulatory Visit: Admit: 2020-07-31 | Discharge: 2020-08-01 | Payer: MEDICARE

## 2020-07-31 DIAGNOSIS — Z23 Encounter for immunization: Principal | ICD-10-CM

## 2020-07-31 DIAGNOSIS — C9112 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: Principal | ICD-10-CM

## 2020-07-31 DIAGNOSIS — D801 Nonfamilial hypogammaglobulinemia: Principal | ICD-10-CM

## 2020-07-31 DIAGNOSIS — C9110 Chronic lymphocytic leukemia of B-cell type not having achieved remission: Principal | ICD-10-CM

## 2020-08-01 DIAGNOSIS — D801 Nonfamilial hypogammaglobulinemia: Principal | ICD-10-CM

## 2020-08-01 DIAGNOSIS — C9112 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: Principal | ICD-10-CM

## 2020-08-13 ENCOUNTER — Other Ambulatory Visit: Payer: Self-pay

## 2020-08-13 ENCOUNTER — Ambulatory Visit (INDEPENDENT_AMBULATORY_CARE_PROVIDER_SITE_OTHER): Payer: Medicare Other | Admitting: Urology

## 2020-08-13 VITALS — BP 156/75 | HR 80 | Ht 68.0 in | Wt 199.0 lb

## 2020-08-13 DIAGNOSIS — R39198 Other difficulties with micturition: Secondary | ICD-10-CM

## 2020-08-13 NOTE — Progress Notes (Signed)
Patient was not seen today and will be rescheduled.

## 2020-09-03 ENCOUNTER — Other Ambulatory Visit: Payer: Self-pay

## 2020-09-03 ENCOUNTER — Ambulatory Visit (INDEPENDENT_AMBULATORY_CARE_PROVIDER_SITE_OTHER): Payer: Medicare Other | Admitting: Urology

## 2020-09-03 ENCOUNTER — Encounter: Payer: Self-pay | Admitting: Urology

## 2020-09-03 VITALS — BP 126/63 | HR 72 | Ht 68.0 in | Wt 200.0 lb

## 2020-09-03 DIAGNOSIS — N5203 Combined arterial insufficiency and corporo-venous occlusive erectile dysfunction: Secondary | ICD-10-CM | POA: Diagnosis not present

## 2020-09-03 DIAGNOSIS — N400 Enlarged prostate without lower urinary tract symptoms: Secondary | ICD-10-CM

## 2020-09-03 DIAGNOSIS — R39198 Other difficulties with micturition: Secondary | ICD-10-CM

## 2020-09-03 LAB — BLADDER SCAN AMB NON-IMAGING: Scan Result: 67

## 2020-09-03 NOTE — Progress Notes (Signed)
09/03/2020 9:37 AM   Nathaniel Butler October 26, 1935 [id number]  Referring provider: Gae Bon, NP [place],  [place] [id number]  Chief Complaint  Patient presents with  . Erectile Dysfunction    HPI: 85 year old male who presents today for further evaluation of erectile dysfunction and urinary difficulty.  Nathaniel Butler lives in a group home and is accompanied today by a caretaker from the home.  I did speak with his daughter today, Nathaniel Butler who lives out of the state both history and plan as well as pain consent today's visit Nathaniel Butler has some mild dementia but is able to provide a history today as well as make some of his own medical decisions.  He has multiple medical comorbidities as outlined below.  He was referred by his primary care provider after he mentioned that he was having some urinary difficulty as well as erectile dysfunction.  He was started on Flomax.  Today, he reports that his urination is actually quite good.  IPSS as below.  He feels like he is able to empty his bladder without urgency or frequency.  No dysuria or gross hematuria.  In addition to the above, he mentions today that he is having issues with erections.  He is only able to achieve partial erections which is bothersome to him.  This been going on for at least a year.  He also reports that he sees urine when he ejaculates and this is also worrisome to him.  He reports he knows its urine because of the way that it looks and smells.  His low volume clear fluid with ejaculation.  He does have a personal history of heart attack and has nitroglycerin as needed.  Additional multiple comorbidities as outlined below.       PMH: Past Medical History:  Diagnosis Date  . Anemia   . Anxiety   . BPH (benign prostatic hyperplasia)   . CAD (coronary artery disease)   . CLL (chronic lymphocytic leukemia) ([place])   . COPD (chronic obstructive pulmonary disease) ([place])   . Dementia ([place])   .  GERD (gastroesophageal reflux disease)   . Hard of hearing   . Hypertension   . Major depressive disorder   . Myocardial infarction ([place]) 2002  . Osteoarthritis     Surgical History: Past Surgical History:  Procedure Laterality Date  . SKIN CANCER EXCISION      Home Medications:  Allergies as of 09/03/2020   No Known Allergies     Medication List       Accurate as of September 03, 2020 11:59 PM. If you have any questions, ask your nurse or doctor.        acetaminophen 650 MG CR tablet Commonly known as: TYLENOL Take 650 mg by mouth every 8 (eight) hours as needed.   albuterol 108 (90 Base) MCG/ACT inhaler Commonly known as: VENTOLIN HFA Inhale into the lungs.   allopurinol 300 MG tablet Commonly known as: ZYLOPRIM Take 300 mg by mouth daily.   aspirin EC 81 MG tablet Take 81 mg by mouth daily.   atorvastatin 10 MG tablet Commonly known as: LIPITOR Take 10 mg by mouth daily at 6 PM.   cetirizine 10 MG tablet Commonly known as: ZYRTEC Take 10 mg by mouth daily.   clonazePAM 0.5 MG tablet Commonly known as: KLONOPIN Take 0.5 mg by mouth.   Dextromethorphan HBr 15 MG Caps Take by mouth.   docusate sodium 100 MG capsule Commonly known as: COLACE  Take 100 mg by mouth daily.   ezetimibe 10 MG tablet Commonly known as: ZETIA Take by mouth.   folic acid 1 MG tablet Commonly known as: FOLVITE Take 1 mg by mouth daily.   gabapentin 600 MG tablet Commonly known as: NEURONTIN Take by mouth.   HYDROcodone-acetaminophen 5-325 MG tablet Commonly known as: NORCO/VICODIN Take by mouth.   lidocaine-prilocaine cream Commonly known as: EMLA Apply topically.   magnesium oxide 400 MG tablet Commonly known as: MAG-OX Take by mouth.   meloxicam 7.5 MG tablet Commonly known as: MOBIC Take 7.5 mg by mouth daily.   memantine 5 MG tablet Commonly known as: NAMENDA   metoprolol succinate 25 MG 24 hr tablet Commonly known as: TOPROL-XL Take by mouth.    nitroGLYCERIN 0.4 MG SL tablet Commonly known as: NITROSTAT Place 0.4 mg under the tongue every 5 (five) minutes as needed for chest pain.   ondansetron 4 MG tablet Commonly known as: ZOFRAN Take by mouth every 8 (eight) hours as needed.   pantoprazole 40 MG tablet Commonly known as: PROTONIX Take 40 mg by mouth daily.   tamsulosin 0.4 MG Caps capsule Commonly known as: FLOMAX Take 0.4 mg by mouth daily after supper.   umeclidinium-vilanterol 62.5-25 MCG/INH Aepb Commonly known as: ANORO ELLIPTA Inhale into the lungs.   venlafaxine 75 MG tablet Commonly known as: EFFEXOR Take by mouth.   Vitamin D3 125 MCG (5000 UT) Tabs Take by mouth daily.       Allergies: No Known Allergies  Family History: Family History  Problem Relation Age of Onset  . Diabetes Mother   . Heart disease Mother   . Heart attack Father     Social History:  reports that he has never smoked. He has never used smokeless tobacco. He reports that he does not drink alcohol and does not use drugs.   Physical Exam: BP 126/63   Pulse 72   Ht 5\' 8"  (1.727 m)   Wt 200 lb (90.7 kg)   BMI 30.41 kg/m   Constitutional:  Alert and oriented, No acute distress. HEENT: [place] AT, moist mucus membranes.  Trachea midline, no masses. Cardiovascular: No clubbing, cyanosis, or edema. Respiratory: Normal respiratory effort, no increased work of breathing. Skin: No rashes, bruises or suspicious lesions. Neurologic: Grossly intact, no focal deficits, moving all 4 extremities. Psychiatric: Normal mood and affect.   Pertinent Imaging: Results for orders placed or performed in visit on 09/03/20  Microscopic Examination   Urine  Result Value Ref Range   WBC, UA 11-30 (A) 0 - 5 /hpf   RBC 0-2 0 - 2 /hpf   Epithelial Cells (non renal) 0-10 0 - 10 /hpf   Renal Epithel, UA 0-10 (A) None seen /hpf   Bacteria, UA None seen None seen/Few  Urinalysis, Complete  Result Value Ref Range   Specific Gravity, UA 1.020  1.005 - 1.030   pH, UA 7.0 5.0 - 7.5   Color, UA Yellow Yellow   Appearance Ur Hazy (A) Clear   Leukocytes,UA Trace (A) Negative   Protein,UA Negative Negative/Trace   Glucose, UA Negative Negative   Ketones, UA Trace (A) Negative   RBC, UA Negative Negative   Bilirubin, UA Negative Negative   Urobilinogen, Ur 0.2 0.2 - 1.0 mg/dL   Nitrite, UA Negative Negative   Microscopic Examination See below:   Bladder Scan (Post Void Residual) in office  Result Value Ref Range   Scan Result 67     Assessment & Plan:  1. Benign prostatic hyperplasia without lower urinary tract symptoms Denies any urinary symptoms today since starting Flomax  Adequate emptying  Continue this medication as deemed necessary  - Urinalysis, Complete - Bladder Scan (Post Void Residual) in office  2. Combined arterial insufficiency and corporo-venous occlusive erectile dysfunction Lengthy discussion today with the patient as well as his daughter  I do not suspect that he is actually urinating when ejaculating based on his description of the fluid and volume.  It sounds like it is just thin ejaculate fluid based on the description.  Patient reassured.  We discussed PDE 5 inhibitors.  Due to his complex social situation as well as a personal history of cardiac disease with a prescription for nitrates as needed, he is not an ideal candidate for sildenafil.  We elected not to treat with this after discussing both with the patient as well as his daughter..  We discussed other treatment options and ultimately came the decision not to pursue any further intervention for this.  Follow-up as needed  Hollice Espy, MD  [facility] [address], [place], [place] [address] [phone]  I spent 45 total minutes on the day of the encounter including pre-visit review of the medical record, face-to-face time with the patient, and post visit ordering of labs/imaging/tests.

## 2020-09-04 LAB — URINALYSIS, COMPLETE
Bilirubin, UA: NEGATIVE
Glucose, UA: NEGATIVE
Nitrite, UA: NEGATIVE
Protein,UA: NEGATIVE
RBC, UA: NEGATIVE
Specific Gravity, UA: 1.02 (ref 1.005–1.030)
Urobilinogen, Ur: 0.2 mg/dL (ref 0.2–1.0)
pH, UA: 7 (ref 5.0–7.5)

## 2020-09-04 LAB — MICROSCOPIC EXAMINATION: Bacteria, UA: NONE SEEN

## 2020-09-16 DIAGNOSIS — C9112 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: Principal | ICD-10-CM

## 2020-09-16 DIAGNOSIS — D801 Nonfamilial hypogammaglobulinemia: Principal | ICD-10-CM

## 2020-09-24 ENCOUNTER — Ambulatory Visit: Admit: 2020-09-24 | Discharge: 2020-09-25 | Payer: MEDICARE

## 2020-09-24 DIAGNOSIS — D801 Nonfamilial hypogammaglobulinemia: Principal | ICD-10-CM

## 2020-09-24 DIAGNOSIS — C9112 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: Principal | ICD-10-CM

## 2020-10-13 ENCOUNTER — Other Ambulatory Visit (HOSPITAL_COMMUNITY): Payer: Self-pay | Admitting: Gastroenterology

## 2020-10-13 ENCOUNTER — Other Ambulatory Visit: Payer: Self-pay | Admitting: Gastroenterology

## 2020-10-13 DIAGNOSIS — K219 Gastro-esophageal reflux disease without esophagitis: Secondary | ICD-10-CM

## 2020-10-13 DIAGNOSIS — R1319 Other dysphagia: Secondary | ICD-10-CM

## 2020-10-20 MED ORDER — NORTRIPTYLINE 10 MG CAPSULE
0 days
Start: 2020-10-20 — End: ?

## 2020-10-22 ENCOUNTER — Ambulatory Visit
Admission: RE | Admit: 2020-10-22 | Discharge: 2020-10-22 | Disposition: A | Discharge status: Home / Self Care | Payer: Medicare Other | Source: Ambulatory Visit | Attending: Gastroenterology | Admitting: Gastroenterology

## 2020-10-22 ENCOUNTER — Other Ambulatory Visit: Payer: Self-pay | Admitting: Gastroenterology

## 2020-10-22 ENCOUNTER — Other Ambulatory Visit: Payer: Self-pay

## 2020-10-22 DIAGNOSIS — R1319 Other dysphagia: Secondary | ICD-10-CM | POA: Insufficient documentation

## 2020-10-22 DIAGNOSIS — K219 Gastro-esophageal reflux disease without esophagitis: Secondary | ICD-10-CM | POA: Diagnosis present

## 2020-10-23 DIAGNOSIS — D801 Nonfamilial hypogammaglobulinemia: Principal | ICD-10-CM

## 2020-10-23 DIAGNOSIS — C9112 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: Principal | ICD-10-CM

## 2020-11-04 DIAGNOSIS — C9110 Chronic lymphocytic leukemia of B-cell type not having achieved remission: Principal | ICD-10-CM

## 2020-11-11 ENCOUNTER — Ambulatory Visit: Admit: 2020-11-11 | Discharge: 2020-11-12 | Payer: MEDICARE

## 2020-11-11 ENCOUNTER — Other Ambulatory Visit: Admit: 2020-11-11 | Discharge: 2020-11-12 | Payer: MEDICARE

## 2020-11-11 ENCOUNTER — Ambulatory Visit
Admit: 2020-11-11 | Discharge: 2020-11-12 | Payer: MEDICARE | Attending: Hematology & Oncology | Primary: Hematology & Oncology

## 2020-11-11 DIAGNOSIS — C9110 Chronic lymphocytic leukemia of B-cell type not having achieved remission: Principal | ICD-10-CM

## 2020-11-11 DIAGNOSIS — D801 Nonfamilial hypogammaglobulinemia: Principal | ICD-10-CM

## 2020-11-11 DIAGNOSIS — C9112 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: Principal | ICD-10-CM

## 2020-12-10 DIAGNOSIS — D801 Nonfamilial hypogammaglobulinemia: Principal | ICD-10-CM

## 2020-12-10 DIAGNOSIS — C9112 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: Principal | ICD-10-CM

## 2021-01-12 ENCOUNTER — Ambulatory Visit: Admit: 2021-01-12 | Discharge: 2021-01-13 | Payer: MEDICARE

## 2021-01-12 DIAGNOSIS — D801 Nonfamilial hypogammaglobulinemia: Principal | ICD-10-CM

## 2021-01-12 DIAGNOSIS — C9112 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: Principal | ICD-10-CM

## 2021-01-13 ENCOUNTER — Telehealth
Admit: 2021-01-13 | Discharge: 2021-01-14 | Payer: MEDICARE | Attending: Hematology & Oncology | Primary: Hematology & Oncology

## 2021-03-05 DEATH — deceased

## 2021-04-28 ENCOUNTER — Encounter: Payer: Self-pay | Admitting: General Surgery

## 2021-08-29 IMAGING — RF DG ESOPHAGUS
8 of 11 series · 14 of 20 positions shown · non-contrast
Comparison: None.

CLINICAL DATA: Esophageal dysphagia

EXAM:
ESOPHOGRAM / BARIUM SWALLOW / BARIUM TABLET STUDY
TECHNIQUE: Combined double contrast and single contrast examination performed
using effervescent crystals, thick barium liquid, and thin barium
liquid. The patient was observed with fluoroscopy swallowing a 13 mm
barium sulphate tablet.
FLUOROSCOPY TIME:  Fluoroscopy Time:  1 minutes 24 seconds
Radiation Exposure Index (if provided by the fluoroscopic device):
19 mGy
Number of Acquired Spot Images: 0

[Series 1: cp_standard · 0.25mm/px · 3 of 42 frames shown (1 of 8)]
[frame 3/42]
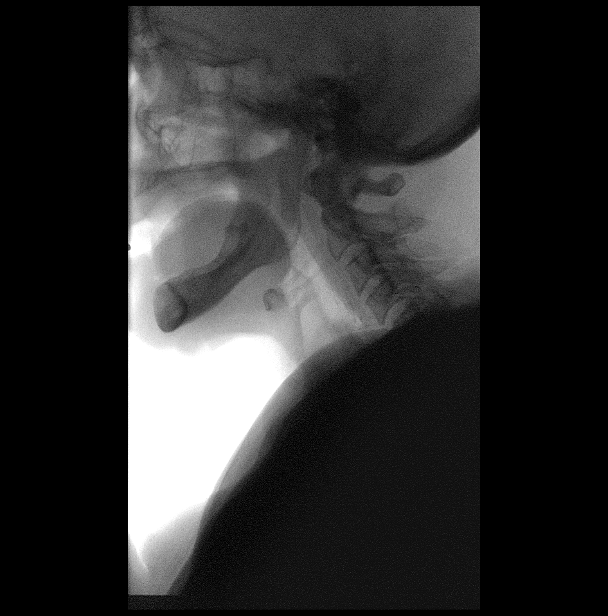
[frame 22/42]
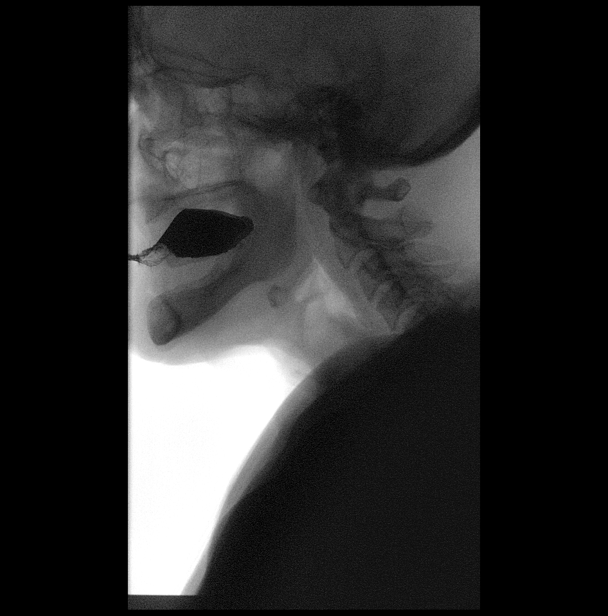
[frame 36/42]
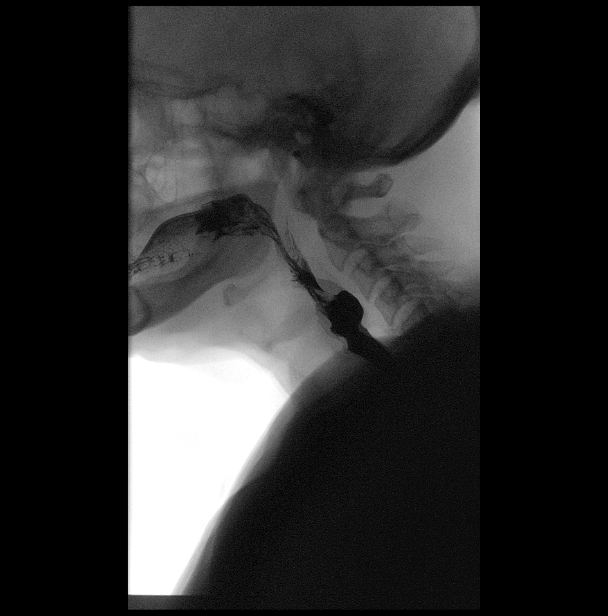

[Series 2: cp_standard · 0.25mm/px · 3 of 35 frames shown (2 of 8)]
[frame 6/35]
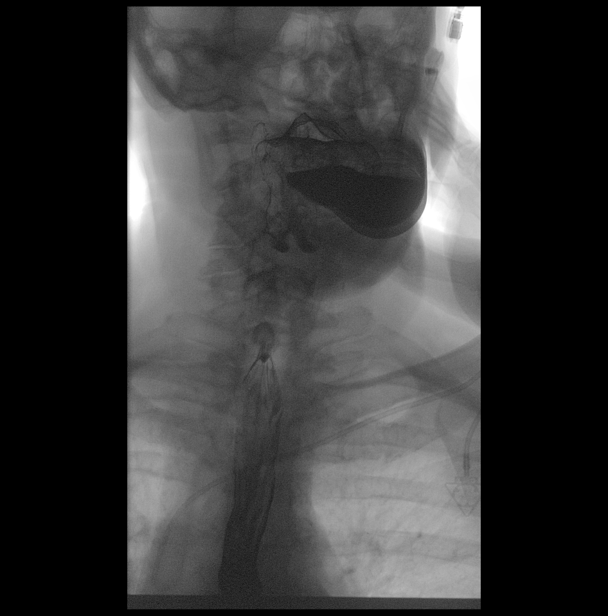
[frame 18/35]
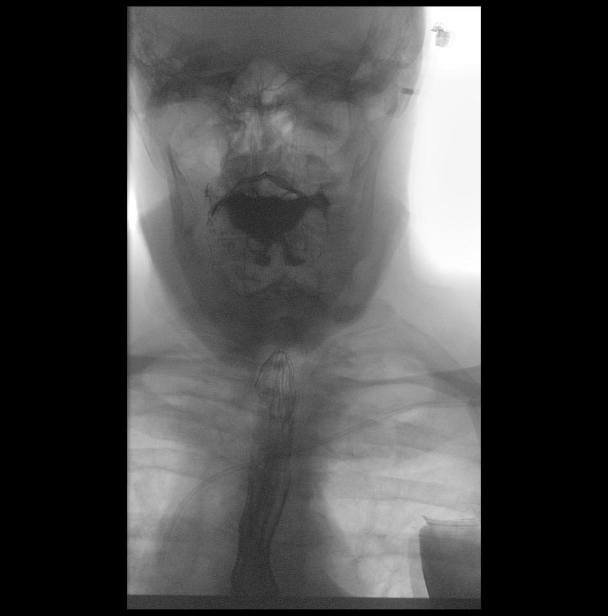
[frame 30/35]
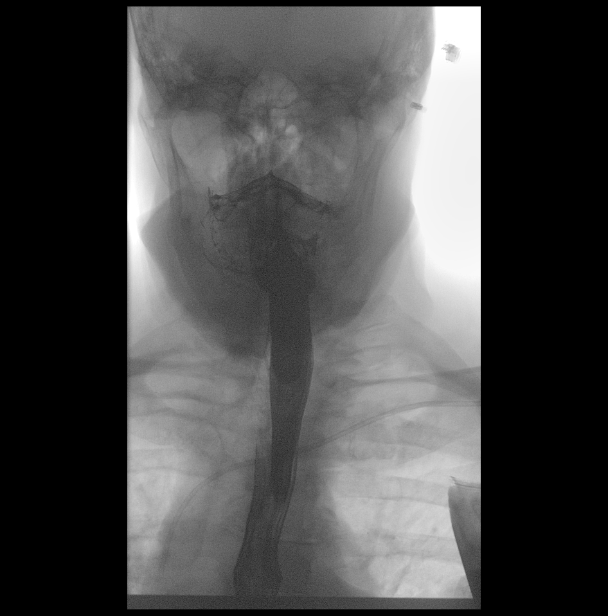

[Series 4: cp_standard · 0.25mm/px · 1 of 1 slices shown (3 of 8)]
[im 1/1]
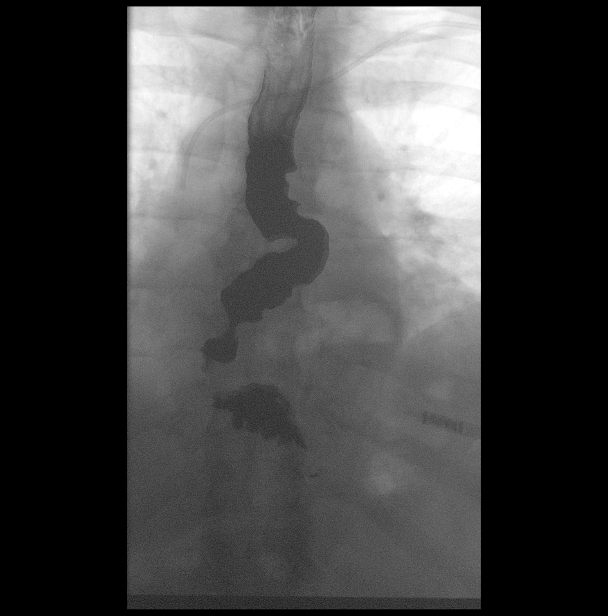

[Series 5: cp_standard · 0.25mm/px · 1 of 1 slices shown (4 of 8)]
[im 1/1]
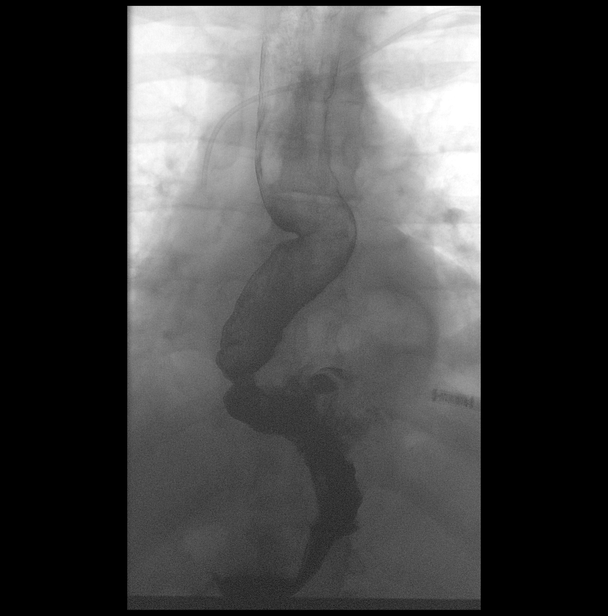

[Series 7: cp_standard · 0.25mm/px · 1 of 1 slices shown (5 of 8)]
[im 1/1]
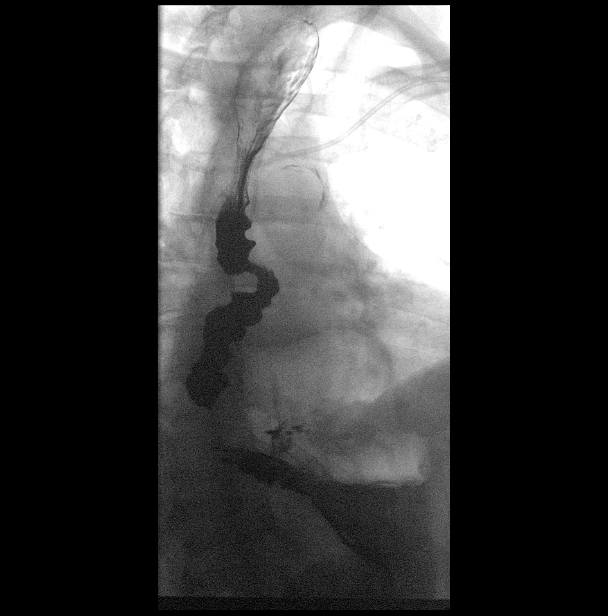

[Series 8: cp_standard · 0.25mm/px · 3 of 43 frames shown (6 of 8)]
[frame 7/43]
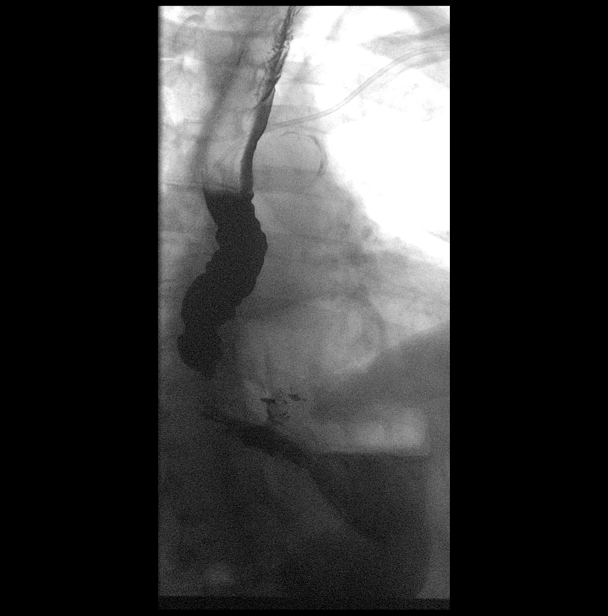
[frame 22/43]
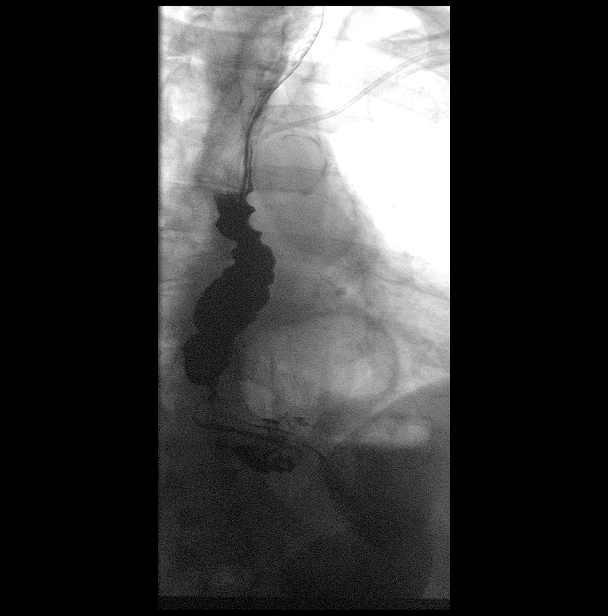
[frame 37/43]
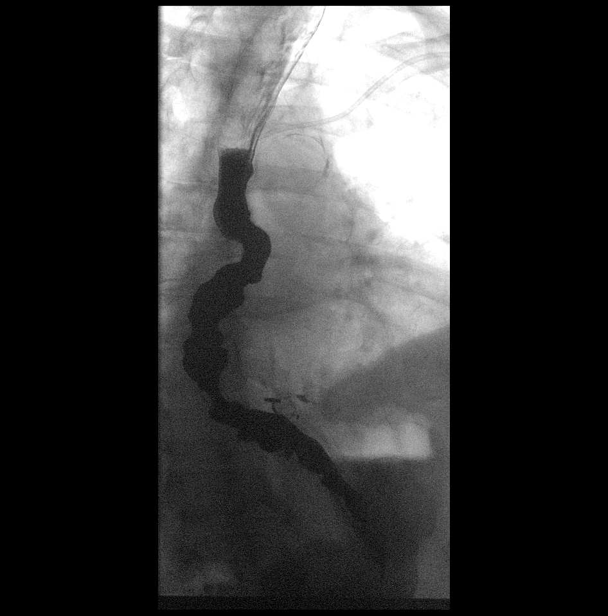

[Series 9: cp_standard · 0.28mm/px · 1 of 1 slices shown (7 of 8)]
[im 1/1]
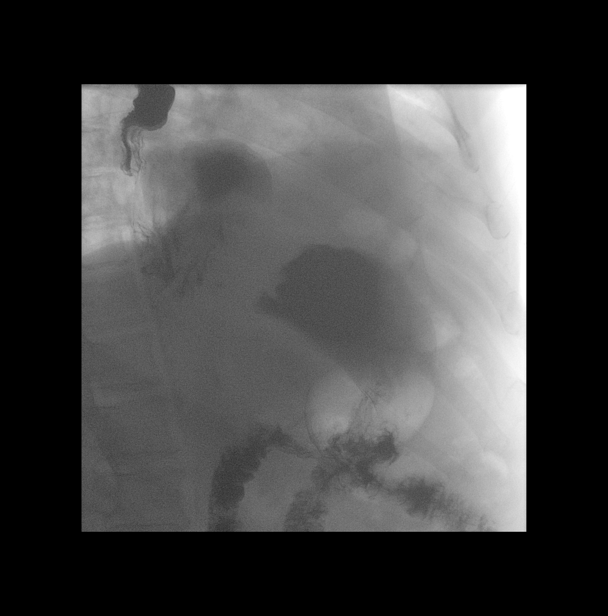

[Series 11: cp_standard · 0.28mm/px · 1 of 1 slices shown (8 of 8)]
[im 1/1]
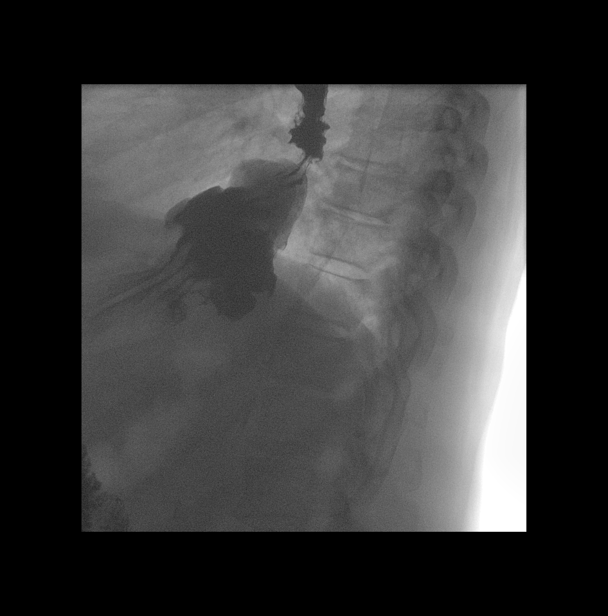

[14 of 20 positions shown; findings below may reference images not displayed]

FINDINGS: Normal pharyngeal anatomy and motility. Contrast flowed freely
through the esophagus without evidence of a stricture or mass.
Normal esophageal mucosa without evidence of irregularity or
ulceration. Severe tertiary contractions of the distal half of the
esophagus consistent with esophageal spasm. Mild gastroesophageal
reflux. Large hiatal hernia.

At the end of the examination a 13 mm barium tablet was administered
which transited through the esophagus and esophagogastric junction
without delay.
IMPRESSION: 1. Severe tertiary contractions of the distal half of the esophagus
consistent with esophageal spasm.
2. Mild gastroesophageal reflux.
3. Large hiatal hernia.
